# Patient Record
Sex: Male | Born: 1950 | Race: White | Hispanic: No | Marital: Married
Health system: Midwestern US, Academic
[De-identification: ages and names within clinical notes are randomized; demographics above are authoritative.]

---

## 2016-05-07 MED ORDER — PERFLUTREN LIPID MICROSPHERES 1.1 MG/ML IV SUSP
1-20 mL | Freq: Once | INTRAVENOUS | 0 refills | Status: CP
Start: 2016-05-07 — End: ?

## 2016-05-07 MED ORDER — GADOBENATE DIMEGLUMINE 529 MG/ML (0.1MMOL/0.2ML) IV SOLN
20 mL | Freq: Once | INTRAVENOUS | 0 refills | Status: CP
Start: 2016-05-07 — End: ?

## 2016-05-18 MED ORDER — LORAZEPAM 1 MG PO TAB
1 mg | Freq: Once | ORAL | 0 refills | Status: CP
Start: 2016-05-18 — End: ?

## 2016-05-21 MED ORDER — SODIUM CHLORIDE 0.9 % IJ SOLN
50 mL | Freq: Once | INTRAVENOUS | 0 refills | Status: CP
Start: 2016-05-21 — End: ?

## 2016-05-21 MED ORDER — IOPAMIDOL 76 % IV SOLN
100 mL | Freq: Once | INTRAVENOUS | 0 refills | Status: CP
Start: 2016-05-21 — End: ?

## 2016-07-09 ENCOUNTER — Encounter: Admit: 2016-07-09 | Discharge: 2016-07-09 | Payer: MEDICARE

## 2016-07-09 DIAGNOSIS — F329 Major depressive disorder, single episode, unspecified: ICD-10-CM

## 2016-07-09 DIAGNOSIS — C439 Malignant melanoma of skin, unspecified: ICD-10-CM

## 2016-07-09 DIAGNOSIS — D709 Neutropenia, unspecified: ICD-10-CM

## 2016-07-09 DIAGNOSIS — R5383 Other fatigue: ICD-10-CM

## 2016-07-09 DIAGNOSIS — Z923 Personal history of irradiation: ICD-10-CM

## 2016-07-09 DIAGNOSIS — F419 Anxiety disorder, unspecified: ICD-10-CM

## 2016-07-09 DIAGNOSIS — C773 Secondary and unspecified malignant neoplasm of axilla and upper limb lymph nodes: ICD-10-CM

## 2016-07-09 DIAGNOSIS — I10 Essential (primary) hypertension: ICD-10-CM

## 2016-07-09 DIAGNOSIS — C499 Malignant neoplasm of connective and soft tissue, unspecified: ICD-10-CM

## 2016-07-09 DIAGNOSIS — R002 Palpitations: ICD-10-CM

## 2016-07-09 DIAGNOSIS — E039 Hypothyroidism, unspecified: ICD-10-CM

## 2016-07-09 DIAGNOSIS — H9190 Unspecified hearing loss, unspecified ear: ICD-10-CM

## 2016-07-09 DIAGNOSIS — S129XXA Fracture of neck, unspecified, initial encounter: ICD-10-CM

## 2016-07-09 DIAGNOSIS — C4359 Malignant melanoma of other part of trunk: Principal | ICD-10-CM

## 2016-07-09 DIAGNOSIS — K219 Gastro-esophageal reflux disease without esophagitis: ICD-10-CM

## 2016-07-09 DIAGNOSIS — C61 Malignant neoplasm of prostate: Principal | ICD-10-CM

## 2016-07-09 DIAGNOSIS — I890 Lymphedema, not elsewhere classified: ICD-10-CM

## 2016-07-09 DIAGNOSIS — M1990 Unspecified osteoarthritis, unspecified site: ICD-10-CM

## 2016-07-09 DIAGNOSIS — E785 Hyperlipidemia, unspecified: ICD-10-CM

## 2016-07-09 DIAGNOSIS — R202 Paresthesia of skin: ICD-10-CM

## 2016-07-09 LAB — COMPREHENSIVE METABOLIC PANEL
Lab: 0.4 mg/dL (ref 0.3–1.2)
Lab: 0.9 mg/dL — ABNORMAL HIGH (ref 0.4–1.24)
Lab: 105 MMOL/L (ref 98–110)
Lab: 136 MMOL/L — ABNORMAL LOW (ref 137–147)
Lab: 17 mg/dL (ref 7–25)
Lab: 22 U/L (ref 7–56)
Lab: 27 MMOL/L (ref 21–30)
Lab: 3.7 MMOL/L — ABNORMAL LOW (ref 3.5–5.1)
Lab: 31 U/L (ref 7–40)
Lab: 4 K/UL (ref 3–12)
Lab: 4 g/dL (ref 3.5–5.0)
Lab: 60 U/L (ref 25–110)
Lab: 7.1 g/dL (ref 6.0–8.0)
Lab: 9.4 mg/dL (ref 8.5–10.6)
Lab: 93 mg/dL (ref 70–100)
Lab: >60 mL/min (ref >60)
Lab: >60 mL/min (ref >60)

## 2016-07-09 LAB — CBC AND DIFF
Lab: 0 10*3/uL (ref 0–0.20)
Lab: 3.2 10*3/uL — ABNORMAL LOW (ref 4.5–11.0)
Lab: 4 M/UL — ABNORMAL LOW (ref 4.4–5.5)

## 2016-07-09 NOTE — Progress Notes
Name: Gabriel Robertson          MRN: [id number]      DOB: [birth year]      AGE: 66 y.o.   DATE OF SERVICE: 07/09/2016    Subjective:             Reason for Visit:  Heme/Onc Care      Gabriel Robertson is a 66 y.o. male.     Cancer Staging  Melanoma (HCC)  Staging form: Melanoma of the Skin, AJCC 7th Edition  - Clinical: No stage assigned - Unsigned  - Pathologic: Stage IIIC (T4b, N2b, cM0) - Signed by Sherril Croon, PA-C on 05/15/2014      History of Present Illness  Name: Gabriel Robertson          MRN: [id number]      DOB: [birth year]      AGE: 66 y.o.   DATE OF SERVICE: 07/09/2016    Subjective:             Reason for Visit: Melanoma  Heme/Onc Care      Jacyon Anuel Quesnel is a 66 y.o. male.     Cancer Staging  Melanoma (HCC)  Staging form: Melanoma of the Skin, AJCC 7th Edition  - Clinical: No stage assigned - Unsigned  - Pathologic: Stage IIIC (T4b, N2b, cM0) - Signed by Sherril Croon, PA-C on 05/15/2014      History of Present Illness      [name] presents with his wife today for follow up of his metastatic melanoma.  He continues on dabrafenib + trametinib, dabrafenib at reduced dose of 75 mg twice daily and trametinib 2 mg daily.   He feels quite well, denies complaints.   He is tolerating targeted agents well.  At decreased dabrafenib dose he is not experiencing fever or chills.   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  He now has a pump to help with swelling, he feels this has been beneficial.   He denies signs or symptoms suggestive of infection.   Denies pain.                Review of Systems   Constitutional: Positive for fatigue (chronic). Negative for activity change, appetite change, chills and fever.   HENT: Negative for mouth sores.    Respiratory: Negative for cough and shortness of breath.    Cardiovascular: Negative for chest pain and leg swelling.   Gastrointestinal: Negative for diarrhea, nausea and vomiting.   Musculoskeletal: Negative for arthralgias and myalgias. Upper extremity swelling    Skin: Negative for rash.   Neurological: Negative for seizures, weakness, light-headedness and headaches.   Psychiatric/Behavioral: The patient is not nervous/anxious.          Objective:         ??? aspirin EC 81 mg tablet Take 81 mg by mouth daily.   ??? dabrafenib (TAFINLAR) 75 mg capsule Take 75 mg in the am and 150 mg in the pm   ??? diazepam (VALIUM) 10 mg tablet Take 1 Tab by mouth every 6 hours as needed for Anxiety.   ??? gabapentin (NEURONTIN) 100 mg capsule Take 3 capsules by mouth three times daily.   ??? HYDROcodone/acetaminophen(+) (NORCO) 10/325 mg tablet    ??? levothyroxine (SYNTHROID) 150 mcg tablet Take 1 Tab by mouth daily 30 minutes before breakfast.   ??? MULTIVITAMIN PO Take  by mouth daily.   ??? ondansetron hcl (ZOFRAN) 8 mg tablet Take 1 Tab by mouth  every 8 hours as needed for Nausea or Vomiting.   ??? polyethylene glycol 3350 (MIRALAX) 17 g packet Take 17 g by mouth daily as needed.   ??? senna/docusate (SENOKOT-S) 8.6/50 mg tablet Take 1 Tab by mouth daily as needed (As needed for constipation). Indications: CONSTIPATION   ??? sertraline (ZOLOFT) 100 mg tablet Take 1 tablet by mouth daily.   ??? trametinib (MEKINIST) 2 mg tablet Take 1 tablet by mouth daily. Take at least 1 hour before or 2 hours after food.   ??? zolpidem (AMBIEN) 10 mg tablet Take 1 tablet by mouth at bedtime as needed for Sleep.     Vitals:    07/09/16 1025   BP: 155/88   Pulse: 55   Resp: 16   Temp: 36.4 ???C (97.5 ???F)   TempSrc: Oral   SpO2: 100%   Weight: 98.2 kg (216 lb 9.6 oz)   Height: 190.5 cm (75)     Body mass index is 27.07 kg/m???.     Pain Score: Six  Pain Loc: Generalized      Pain Addressed:  N/A    Patient Evaluated for a Clinical Trial: No treatment clinical trial available for this patient.     [place] Cooperative Oncology Group performance status is 0, Fully active, able to carry on all pre-disease performance without restriction.Marland Kitchen     Physical Exam Constitutional: He is oriented to person, place, and time. He appears well-developed and well-nourished. No distress.   HENT:   Head: Normocephalic.   Mouth/Throat: Oropharynx is clear and moist.   Eyes: Conjunctivae and EOM are normal. Pupils are equal, round, and reactive to light.   Cardiovascular: Normal rate, regular rhythm and normal heart sounds.    Pulmonary/Chest: Effort normal and breath sounds normal.   Extensive black skin lesions scattered across anterior right chest extending to right scapula, macular, less pigmented     Skin lesions also at left anterior chest, stable   Abdominal: Soft. Bowel sounds are normal. He exhibits no mass. There is no hepatosplenomegaly. There is no tenderness. There is no guarding.   Musculoskeletal: He exhibits no edema.   Bilateral upper extremity lymphedema   Lymphadenopathy:     He has no cervical adenopathy.   Left axillae, fluid collection, unchanged   Neurological: He is alert and oriented to person, place, and time. No cranial nerve deficit.   Skin: Skin is warm and dry. No rash noted.   Psychiatric: He has a normal mood and affect. His behavior is normal. Judgment and thought content normal.        CBC w/Diff    Lab Results   Component Value Date/Time    WBC 3.2 (L) 07/09/2016 09:25 AM    RBC 4.01 (L) 07/09/2016 09:25 AM    HGB 12.8 (L) 07/09/2016 09:25 AM    HCT 36.4 (L) 07/09/2016 09:25 AM    MCV 90.8 07/09/2016 09:25 AM    MCH 31.9 07/09/2016 09:25 AM    MCHC 35.1 07/09/2016 09:25 AM    RDW 15.2 (H) 07/09/2016 09:25 AM    PLTCT 189 07/09/2016 09:25 AM    MPV 7.3 07/09/2016 09:25 AM    Lab Results   Component Value Date/Time    NEUT 57 07/09/2016 09:25 AM    ANC 1.90 07/09/2016 09:25 AM    LYMA 31 07/09/2016 09:25 AM    ALC 1.00 07/09/2016 09:25 AM    MONA 10 07/09/2016 09:25 AM    AMC 0.30 07/09/2016 09:25 AM  EOSA 1 07/09/2016 09:25 AM    AEC 0.00 07/09/2016 09:25 AM    BASA 1 07/09/2016 09:25 AM    ABC 0.00 07/09/2016 09:25 AM Comprehensive Metabolic Profile    Lab Results   Component Value Date/Time    NA 136 (L) 07/09/2016 09:25 AM    K 3.7 07/09/2016 09:25 AM    CL 105 07/09/2016 09:25 AM    CO2 27 07/09/2016 09:25 AM    GAP 4 07/09/2016 09:25 AM    BUN 17 07/09/2016 09:25 AM    CR 0.93 07/09/2016 09:25 AM    GLU 93 07/09/2016 09:25 AM    Lab Results   Component Value Date/Time    CA 9.4 07/09/2016 09:25 AM    PO4 2.7 02/15/2015 04:35 PM    ALBUMIN 4.0 07/09/2016 09:25 AM    TOTPROT 7.1 07/09/2016 09:25 AM    ALKPHOS 60 07/09/2016 09:25 AM    AST 31 07/09/2016 09:25 AM    ALT 22 07/09/2016 09:25 AM    TOTBILI 0.4 07/09/2016 09:25 AM    GFR >60 07/09/2016 09:25 AM    GFRAA >60 07/09/2016 09:25 AM           Assessment and Plan:    Problem   Fatigue   Lymphedema of Upper Extremity   Neutropenia (Hcc)   Melanoma (Hcc)    DIAGNOSIS: Melanoma  BRAF STATUS: POSITIVE  PAST THERAPY: S/P excisional biopsy of mid back lesion (03/19/2014) revealing malignant melanoma, superficial spreading, 5mm, with ulceration, mitotic index 5/mm2. Regression, microsatellitosis, perineural invasion, and tumor infiltrating lymphocytes were all present. Suspicious for lymphovascular invasion. He underwent WLE and bilateral SLN biopsy (05/04/2014). Negative for residual melanoma with 1/2 right axillary sentinel LNs positive for metastatic melanoma and 3/5 left axillary sentinel LNs positive. No extracapsular extension noted. MRI brain & PET (05/17/2014) were negative for distant metastatic disease. S/P bilateral completion axillary lymphadenectomies (06/30/2014). Pathology demonstrated metastatic melanoma in 3/10 right axillary nodes with multiple foci of melanoma involving the perinodal soft tissue. There was no evidence of melanoma in 11 left axillary nodes; however, there was metastatic melanoma noted involving the perinodal soft tissue on the left too. He completed radiation therapy to the bilateral axillae and back (08/2014). 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 S/P Keytruda x 3 cycles, stopped secondary to disease progression. He was then started on dabrafenib + trametinib (01/2015). The dabrafenib was dose-reduced to 75mg  BID secondary to neutropenia (02/26/2015), and then stopped secondary to fevers (03/04/2015)  PRESENT THERAPY: Dabrafenib 75mg  twice daily + trametinib 2mg  daily  Patient consented for the Melanoma Survivorship Registry 06/08/2016         Lymphedema of upper extremity  Stable, continues to follow with lymphedema specialist    Melanoma (HCC)  No progressive disease by history or exam    Neutropenia (HCC)  ANC is stable    Fatigue  Chronic, unchanged    PLAN  -Mr. Sigala is tolerating dab/tramtinib well.  He will continue dabrafenib at reduced dose of 75 mg twice daily in addition to trametinib 2 mg daily  -He has appointment scheduled with Dr. Regino Schultze on 8/2.  Will see him that day with CBC, CMP  -Plan for echo, CT CAP in 8 weeks  -Continue follow up with lymphedema specialist as scheduled  -Encouraged close F/U in the interim as needed.  Mr. Breaker verbalized understanding and agrees to plan as outlined above  Twanna Hy, ARNP

## 2016-07-09 NOTE — Assessment & Plan Note
Stable, continues to follow with lymphedema specialist

## 2016-07-09 NOTE — Assessment & Plan Note
ANC is stable

## 2016-07-09 NOTE — Assessment & Plan Note
No progressive disease by history or exam

## 2016-07-09 NOTE — Assessment & Plan Note
Chronic, unchanged

## 2016-08-03 ENCOUNTER — Encounter: Admit: 2016-08-03 | Discharge: 2016-08-03 | Payer: MEDICARE

## 2016-08-10 DIAGNOSIS — L599 Disorder of the skin and subcutaneous tissue related to radiation, unspecified: Principal | ICD-10-CM

## 2016-08-18 ENCOUNTER — Encounter: Admit: 2016-08-18 | Discharge: 2016-08-18 | Payer: MEDICARE

## 2016-08-18 ENCOUNTER — Encounter: Admit: 2016-08-10 | Discharge: 2016-08-18 | Payer: MEDICARE

## 2016-08-18 DIAGNOSIS — C439 Malignant melanoma of skin, unspecified: Principal | ICD-10-CM

## 2016-08-18 DIAGNOSIS — L905 Scar conditions and fibrosis of skin: ICD-10-CM

## 2016-08-19 ENCOUNTER — Ambulatory Visit: Admit: 2016-08-19 | Discharge: 2016-09-02 | Payer: MEDICARE

## 2016-08-20 ENCOUNTER — Encounter: Admit: 2016-08-20 | Discharge: 2016-08-21 | Payer: MEDICARE

## 2016-08-20 ENCOUNTER — Encounter: Admit: 2016-08-20 | Discharge: 2016-08-20 | Payer: MEDICARE

## 2016-08-20 ENCOUNTER — Ambulatory Visit: Admit: 2016-08-20 | Discharge: 2016-08-20 | Payer: MEDICARE

## 2016-08-20 ENCOUNTER — Encounter: Admit: 2016-08-20 | Discharge: 2016-09-18 | Payer: MEDICARE

## 2016-08-20 DIAGNOSIS — Z923 Personal history of irradiation: ICD-10-CM

## 2016-08-20 DIAGNOSIS — R5383 Other fatigue: ICD-10-CM

## 2016-08-20 DIAGNOSIS — C773 Secondary and unspecified malignant neoplasm of axilla and upper limb lymph nodes: ICD-10-CM

## 2016-08-20 DIAGNOSIS — C4359 Malignant melanoma of other part of trunk: Principal | ICD-10-CM

## 2016-08-20 DIAGNOSIS — F419 Anxiety disorder, unspecified: ICD-10-CM

## 2016-08-20 DIAGNOSIS — C7931 Secondary malignant neoplasm of brain: ICD-10-CM

## 2016-08-20 DIAGNOSIS — F329 Major depressive disorder, single episode, unspecified: ICD-10-CM

## 2016-08-20 DIAGNOSIS — Z7982 Long term (current) use of aspirin: ICD-10-CM

## 2016-08-20 DIAGNOSIS — C61 Malignant neoplasm of prostate: Principal | ICD-10-CM

## 2016-08-20 DIAGNOSIS — K219 Gastro-esophageal reflux disease without esophagitis: ICD-10-CM

## 2016-08-20 DIAGNOSIS — E039 Hypothyroidism, unspecified: ICD-10-CM

## 2016-08-20 DIAGNOSIS — H9190 Unspecified hearing loss, unspecified ear: ICD-10-CM

## 2016-08-20 DIAGNOSIS — E785 Hyperlipidemia, unspecified: ICD-10-CM

## 2016-08-20 DIAGNOSIS — I10 Essential (primary) hypertension: ICD-10-CM

## 2016-08-20 DIAGNOSIS — R002 Palpitations: ICD-10-CM

## 2016-08-20 DIAGNOSIS — C439 Malignant melanoma of skin, unspecified: ICD-10-CM

## 2016-08-20 DIAGNOSIS — R202 Paresthesia of skin: ICD-10-CM

## 2016-08-20 DIAGNOSIS — M1990 Unspecified osteoarthritis, unspecified site: ICD-10-CM

## 2016-08-20 DIAGNOSIS — S129XXA Fracture of neck, unspecified, initial encounter: ICD-10-CM

## 2016-08-20 DIAGNOSIS — D709 Neutropenia, unspecified: ICD-10-CM

## 2016-08-20 LAB — CBC AND DIFF
Lab: 2.9 10*3/uL — ABNORMAL LOW (ref 4.5–11.0)
Lab: 3.7 M/UL — ABNORMAL LOW (ref 4.4–5.5)

## 2016-08-20 LAB — COMPREHENSIVE METABOLIC PANEL
Lab: 1 mg/dL (ref 0.4–1.24)
Lab: 101 MMOL/L (ref 98–110)
Lab: 136 MMOL/L — ABNORMAL LOW (ref 137–147)
Lab: 20 mg/dL (ref 7–25)
Lab: 3.9 MMOL/L — ABNORMAL LOW (ref 3.5–5.1)
Lab: 6.6 g/dL (ref 6.0–8.0)
Lab: 9 mg/dL (ref 8.5–10.6)
Lab: 94 mg/dL (ref 70–100)

## 2016-08-20 LAB — POC CREATININE, RAD: Lab: 1.1 mg/dL (ref 0.4–1.24)

## 2016-08-20 MED ORDER — SODIUM CHLORIDE 0.9 % IJ SOLN
10 mL | Freq: Once | INTRAVENOUS | 0 refills | Status: CP
Start: 2016-08-20 — End: ?
  Administered 2016-08-20: 16:00:00 10 mL via INTRAVENOUS

## 2016-08-20 MED ORDER — GADOBENATE DIMEGLUMINE 529 MG/ML (0.1MMOL/0.2ML) IV SOLN
20 mL | Freq: Once | INTRAVENOUS | 0 refills | Status: CP
Start: 2016-08-20 — End: ?
  Administered 2016-08-20: 16:00:00 20 mL via INTRAVENOUS

## 2016-08-20 MED ORDER — LORAZEPAM 1 MG PO TAB
1 mg | ORAL_TABLET | ORAL | 0 refills | 12.00000 days | Status: AC | PRN
Start: 2016-08-20 — End: 2016-11-10

## 2016-08-20 NOTE — Progress Notes
Date:  08/20/2016       Gabriel Robertson is a 66 y.o. male.     There were no encounter diagnoses.  Staging: Cancer Staging  Melanoma (HCC)  Staging form: Melanoma of the Skin, AJCC 7th Edition  - Clinical: No stage assigned - Unsigned  - Pathologic: Stage IIIC (T4b, N2b, cM0) - Signed by Sherril Croon, PA-C on 05/15/2014        Subjective:          Cancer Staging  Melanoma (HCC)  Staging form: Melanoma of the Skin, AJCC 7th Edition  - Clinical: No stage assigned - Unsigned  - Pathologic: Stage IIIC (T4b, N2b, cM0) - Signed by Sherril Croon, PA-C on 05/15/2014      History of Present Illness  Prior RT:  1. SRS to right frontal lobe metastasis, completed1/16/18  2. SRS to L frontotemporal lobe and right cerebellum, 20 Gy x 1, completed 05/18/16    Gabriel Robertson is a 66 yo gentleman with metastatic melanoma to the brain s/p SRS courses as above. He continues on dabrafenib and tremetinib. He returns today for follow-up with MRI. He endorses overall weakness and some dizziness recently. He denies headaches, N/V, or unsteady gait. No acute vision changes. He denies seizures. No focal extremity weakness or numbness.      Review of Systems   HENT: Positive for hearing loss and tinnitus.    Musculoskeletal: Positive for arthralgias, joint swelling, neck pain and neck stiffness.   Neurological: Positive for dizziness and light-headedness.   All other systems reviewed and are negative.        Objective:         ??? aspirin EC 81 mg tablet Take 81 mg by mouth daily.   ??? dabrafenib (TAFINLAR) 75 mg capsule Take 75 mg in the am and 150 mg in the pm   ??? diazepam (VALIUM) 10 mg tablet Take 1 Tab by mouth every 6 hours as needed for Anxiety.   ??? gabapentin (NEURONTIN) 100 mg capsule Take 3 capsules by mouth three times daily.   ??? HYDROcodone/acetaminophen(+) (NORCO) 10/325 mg tablet    ??? levothyroxine (SYNTHROID) 150 mcg tablet Take 1 Tab by mouth daily 30 minutes before breakfast.   ??? MULTIVITAMIN PO Take  by mouth daily. ??? ondansetron hcl (ZOFRAN) 8 mg tablet Take 1 Tab by mouth every 8 hours as needed for Nausea or Vomiting.   ??? polyethylene glycol 3350 (MIRALAX) 17 g packet Take 17 g by mouth daily as needed.   ??? senna/docusate (SENOKOT-S) 8.6/50 mg tablet Take 1 Tab by mouth daily as needed (As needed for constipation). Indications: CONSTIPATION   ??? sertraline (ZOLOFT) 100 mg tablet Take 1 tablet by mouth daily.   ??? trametinib (MEKINIST) 2 mg tablet Take 1 tablet by mouth daily. Take at least 1 hour before or 2 hours after food.   ??? zolpidem (AMBIEN) 10 mg tablet Take 1 tablet by mouth at bedtime as needed for Sleep.     Vitals:    08/20/16 1336   BP: 102/64   Pulse: 58   Temp: 36.2 ???C (97.2 ???F)   TempSrc: Temporal   SpO2: 98%   Weight: 99.2 kg (218 lb 12.8 oz)   Height: 190.5 cm (75)     Body mass index is 27.35 kg/m???.     Pain Score: Seven        KARNOFSKY PERFORMANCE SCORE:  90% Able to carry on normal activity; minor signs of disease  Physical Exam     General:  AAOx3, in no acute distress.  Head:  Normocephalic and atraumatic.  Eyes: Normal sclerae / conjunctivae. EOMI.  Neck: Supple, trachea midline, no palpable lymphadenopathy  Lungs: Breathing nonlabored, no wheezing  Extremities: warm, no edema  Neurologic:  CN II-XII grossly intact, no focal motor deficits.   Psychiatric: Normal mood and affect         Mri Head Wo/w Contrast    Result Date: 08/20/2016  MRI BRAIN HISTORY: 66 year old male, brain metastasis, malignant melanoma, unspecified site TECHNIQUE: Multiplanar and multisequence MR imaging of the head was performed. This was done both before and after the administration of gadolinium contrast. COMPARISON: MRI brain May 06, 2016 FINDINGS: No significant change in size of small 4 mm right frontal lobe and tiny 2 mm left precentral gyrus enhancing metastases (series 11 images 145 and 189). Development of a new tiny 1 to 2 mm cortically based enhancing lesion within the right deep precentral sulcus (series 11 image 186). Additionally there is been development of or increased conspicuity of two small enhancing right cerebellar lesions (series 11 image 58 and 38). Additional small area of enhancement within the right central sulcus (series 11 image 181) is felt to be vascular in nature. The ventricles and subarachnoid spaces are normal in size and configuration. Brain parenchyma is otherwise normal in signal. There is no midline shift or mass effect. The vascular flow-voids are unremarkable. Diffusion weighted imaging is not indicative of acute or recent infarct. Patchy signal intensity and focal linear enhancement within the left upper clivus is unchanged (series 11 image 54, series 7 image 6).     1.  Stable small right frontal lobe and left precentral gyrus enhancing metastases. 2.  Development of a tiny right deep precentral sulcus and two small right cerebellar enhancing lesions concerning for additional metastases.  Approved by Elfredia Nevins, M.D. on 08/20/2016 11:51 AM By my electronic signature, I attest that I have personally reviewed the images for this examination and formulated the interpretations and opinions expressed in this report  Finalized by Dimple Casey, M.D. on 08/20/2016 12:08 PM. Dictated by Elfredia Nevins, M.D. on 08/20/2016 11:19 AM.        Assessment and Plan:  66 yo gentleman with metastatic melanoma to the brain s/p SRS to a right frontal lesion in 01/2016 and more recently SRS to a right cerebellar and left frontotemporal lobe metastasis in April 2018.     MRI shows growth in three intracranial lesions that have not been previously treated: a right anterior frontal lesion, a right lateral frontal lesion, and a right cerebellar lesion. The MRI reports development of two right cerebeller lesions, however one has been previously treated in April and appears stable.     Recommend SRS to 20 Gy to three enlarging metastatic lesions. Patient agreed to proceed with treatment. Informed consent obtained, simulation scheduled for next week.     April Manson, M.D.  Radiation Oncology Resident, PGY-3  [facility]  Pager [phone]         ATTESTATION    I personally performed the key portions of the E/M visit, discussed case with resident and concur with resident documentation of history, physical exam, assessment, and treatment plan unless otherwise noted.    Staff name:  Jason Nest, MD Date:  08/21/2016

## 2016-08-21 NOTE — Assessment & Plan Note
Grade 1, unchanged

## 2016-08-21 NOTE — Assessment & Plan Note
Managing for the most part

## 2016-08-21 NOTE — Assessment & Plan Note
ANC has been stable, 1.5 today

## 2016-08-21 NOTE — Progress Notes
Name: Gabriel Robertson          MRN: [id number]      DOB: 02/03/1950      AGE: 66 y.o.   DATE OF SERVICE: 08/20/2016    Subjective:             Reason for Visit:  Heme/Onc Care      Gabriel Robertson is a 66 y.o. male.     Cancer Staging  Melanoma (HCC)  Staging form: Melanoma of the Skin, AJCC 7th Edition  - Clinical: No stage assigned - Unsigned  - Pathologic: Stage IIIC (T4b, N2b, cM0) - Signed by Sherril Croon, PA-C on 05/15/2014      History of Present Illness    Name: Gabriel Robertson          MRN: [id number]      DOB: [birth year]      AGE: 66 y.o.   DATE OF SERVICE: 08/20/2016    Subjective:             Reason for Visit: Melanoma  Heme/Onc Care      Gabriel Robertson is a 66 y.o. male.     Cancer Staging  Melanoma (HCC)  Staging form: Melanoma of the Skin, AJCC 7th Edition  - Clinical: No stage assigned - Unsigned  - Pathologic: Stage IIIC (T4b, N2b, cM0) - Signed by Sherril Croon, PA-C on 05/15/2014      History of Present Illness      Gabriel Robertson presents with his wife today for follow up of his metastatic melanoma.  He continues on dabrafenib + trametinib, dabrafenib at reduced dose of 75 mg twice daily and trametinib 2 mg daily.   He reports feeling well.  He denies adverse symptoms related to targeted agents.   He denies signs or symptoms suggestive of infection.   He has mild arthralgias that are unchanged.   He had MRI head today and follow up with Dr. Regino Schultze.                Review of Systems   Constitutional: Positive for fatigue. Negative for activity change, appetite change, chills and fever.   HENT: Negative for mouth sores.    Respiratory: Negative for cough, shortness of breath and wheezing.    Cardiovascular: Negative for chest pain, palpitations and leg swelling.   Gastrointestinal: Positive for constipation. Negative for abdominal distention, abdominal pain, diarrhea, nausea and vomiting.   Musculoskeletal: Positive for arthralgias. Negative for myalgias.        Upper extremity swelling Skin: Negative for rash.   Neurological: Negative for seizures, weakness, light-headedness and headaches.   Psychiatric/Behavioral: The patient is not nervous/anxious.          Objective:         ??? aspirin EC 81 mg tablet Take 81 mg by mouth daily.   ??? dabrafenib (TAFINLAR) 75 mg capsule Take 75 mg in the am and 150 mg in the pm   ??? diazepam (VALIUM) 10 mg tablet Take 1 Tab by mouth every 6 hours as needed for Anxiety.   ??? gabapentin (NEURONTIN) 100 mg capsule Take 3 capsules by mouth three times daily.   ??? HYDROcodone/acetaminophen(+) (NORCO) 10/325 mg tablet    ??? levothyroxine (SYNTHROID) 150 mcg tablet Take 1 Tab by mouth daily 30 minutes before breakfast.   ??? LORazepam (ATIVAN) 1 mg tablet Take 1 tablet by mouth every 8 hours as needed for Other.... Take 1-2 tabs 30 minutes prior to CT simulation  and radiation treatment   ??? MULTIVITAMIN PO Take  by mouth daily.   ??? ondansetron hcl (ZOFRAN) 8 mg tablet Take 1 Tab by mouth every 8 hours as needed for Nausea or Vomiting.   ??? polyethylene glycol 3350 (MIRALAX) 17 g packet Take 17 g by mouth daily as needed.   ??? senna/docusate (SENOKOT-S) 8.6/50 mg tablet Take 1 Tab by mouth daily as needed (As needed for constipation). Indications: CONSTIPATION   ??? sertraline (ZOLOFT) 100 mg tablet Take 1 tablet by mouth daily.   ??? trametinib (MEKINIST) 2 mg tablet Take 1 tablet by mouth daily. Take at least 1 hour before or 2 hours after food.   ??? zolpidem (AMBIEN) 10 mg tablet Take 1 tablet by mouth at bedtime as needed for Sleep.     Vitals:    08/20/16 1134   BP: 123/67   Pulse: 51   Resp: 16   Temp: 36.4 ???C (97.5 ???F)   TempSrc: Axillary   SpO2: 99%   Weight: 99 kg (218 lb 3.2 oz)   Height: 190.5 cm (75)     Body mass index is 27.27 kg/m???.     Pain Score: Seven  Pain Loc: Neck (joint pain)      Pain Addressed:  N/A    Patient Evaluated for a Clinical Trial: No treatment clinical trial available for this patient. [place] Cooperative Oncology Group performance status is 0, Fully active, able to carry on all pre-disease performance without restriction.Marland Kitchen     Physical Exam   Constitutional: He is oriented to person, place, and time. He appears well-developed and well-nourished. No distress.   HENT:   Head: Normocephalic.   Mouth/Throat: Oropharynx is clear and moist.   Eyes: Conjunctivae and EOM are normal. Pupils are equal, round, and reactive to light.   Cardiovascular: Normal rate, regular rhythm and normal heart sounds.    Pulmonary/Chest: Effort normal and breath sounds normal.   Extensive black skin lesions scattered across anterior right chest extending to right scapula, macular, less pigmented     Skin lesions also at left anterior chest, unchanged   Abdominal: Soft. Bowel sounds are normal. He exhibits no mass. There is no hepatosplenomegaly. There is no tenderness. There is no guarding.   Musculoskeletal: He exhibits no edema.   Bilateral upper extremity lymphedema, R>L   Lymphadenopathy:     He has no cervical adenopathy.   Left axillae, fluid collection, unchanged   Neurological: He is alert and oriented to person, place, and time. No cranial nerve deficit.   Skin: Skin is warm and dry. No rash noted.   Psychiatric: He has a normal mood and affect. His behavior is normal. Judgment and thought content normal.        CBC w/Diff    Lab Results   Component Value Date/Time    WBC 2.9 (L) 08/20/2016 10:15 AM    RBC 3.76 (L) 08/20/2016 10:15 AM    HGB 12.0 (L) 08/20/2016 10:15 AM    HCT 34.4 (L) 08/20/2016 10:15 AM    MCV 91.3 08/20/2016 10:15 AM    MCH 31.8 08/20/2016 10:15 AM    MCHC 34.8 08/20/2016 10:15 AM    RDW 14.5 08/20/2016 10:15 AM    PLTCT 183 08/20/2016 10:15 AM    MPV 7.0 08/20/2016 10:15 AM    Lab Results   Component Value Date/Time    NEUT 52 08/20/2016 10:15 AM    ANC 1.50 (L) 08/20/2016 10:15 AM    LYMA 33 08/20/2016  10:15 AM    ALC 1.00 08/20/2016 10:15 AM    MONA 12 08/20/2016 10:15 AM AMC 0.30 08/20/2016 10:15 AM    EOSA 2 08/20/2016 10:15 AM    AEC 0.00 08/20/2016 10:15 AM    BASA 1 08/20/2016 10:15 AM    ABC 0.00 08/20/2016 10:15 AM      Comprehensive Metabolic Profile    Lab Results   Component Value Date/Time    NA 136 (L) 08/20/2016 10:15 AM    K 3.9 08/20/2016 10:15 AM    CL 101 08/20/2016 10:15 AM    CO2 32 (H) 08/20/2016 10:15 AM    GAP 3 08/20/2016 10:15 AM    BUN 20 08/20/2016 10:15 AM    CR 1.04 08/20/2016 10:15 AM    GLU 94 08/20/2016 10:15 AM    Lab Results   Component Value Date/Time    CA 9.0 08/20/2016 10:15 AM    PO4 2.7 02/15/2015 04:35 PM    ALBUMIN 3.9 08/20/2016 10:15 AM    TOTPROT 6.6 08/20/2016 10:15 AM    ALKPHOS 67 08/20/2016 10:15 AM    AST 37 08/20/2016 10:15 AM    ALT 19 08/20/2016 10:15 AM    TOTBILI 0.3 08/20/2016 10:15 AM    GFR >60 08/20/2016 10:15 AM    GFRAA >60 08/20/2016 10:15 AM           Assessment and Plan:    Problem   Fatigue   Neutropenia (Hcc)   Anxiety and Depression   Melanoma (Hcc)    DIAGNOSIS: Melanoma  BRAF STATUS: POSITIVE  PAST THERAPY: S/P excisional biopsy of mid back lesion (03/19/2014) revealing malignant melanoma, superficial spreading, 5mm, with ulceration, mitotic index 5/mm2. Regression, microsatellitosis, perineural invasion, and tumor infiltrating lymphocytes were all present. Suspicious for lymphovascular invasion. He underwent WLE and bilateral SLN biopsy (05/04/2014). Negative for residual melanoma with 1/2 right axillary sentinel LNs positive for metastatic melanoma and 3/5 left axillary sentinel LNs positive. No extracapsular extension noted. MRI brain & PET (05/17/2014) were negative for distant metastatic disease. S/P bilateral completion axillary lymphadenectomies (06/30/2014). Pathology demonstrated metastatic melanoma in 3/10 right axillary nodes with multiple foci of melanoma involving the perinodal soft tissue. There was no evidence of melanoma in 11 left axillary nodes; however, there was metastatic melanoma noted involving the perinodal soft tissue on the left too. He completed radiation therapy to the bilateral axillae and back (08/2014). 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 S/P Keytruda x 3 cycles, stopped secondary to disease progression. He was then started on dabrafenib + trametinib (01/2015). The dabrafenib was dose-reduced to 75mg  BID secondary to neutropenia (02/26/2015), and then stopped secondary to fevers (03/04/2015)  PRESENT THERAPY: Dabrafenib 75mg  twice daily + trametinib 2mg  daily  Patient consented for the Melanoma Survivorship Registry 06/08/2016           Melanoma [facility])  No clear progressive disease by history or exam  MRI head pending, he has appointment scheduled with Dr. Regino Schultze today  CT scheduled in 2 weeks      Anxiety and depression  Managing for the most part    Neutropenia (HCC)  ANC has been stable, 1.5 today    Fatigue  Grade 1, unchanged    PLAN  -Gabriel Robertson is tolerating dab/tramtinib well.  He will continue dabrafenib at reduced dose of 75 mg twice daily in addition to trametinib 2 mg daily  -His MRI  head is pending, he has appointment with Dr. Regino Schultze this afternoon to discuss MRI from today  -Plan for echo, CT CAP in 3 weeks  -RTC to see Dr. Merla Riches following staging scans  -Continue follow up with lymphedema specialist as scheduled  -Encouraged close F/U in the interim as needed.  Gabriel Robertson verbalized understanding and agrees to plan as outlined above    Twanna Hy, ARNP         Addendum:   IMPRESSION      1. ???Stable small right frontal lobe and left precentral gyrus enhancing   metastases.  2. ???Development of a tiny right deep precentral sulcus and two small right   cerebellar enhancing lesions concerning for additional metastases.      MRI head results above.  He is to undergo SRS to new lesions.  Will further evaluate disease as scheduled in two weeks.  Will discuss with Dr. Merla Riches and develop plan based on results.

## 2016-08-25 ENCOUNTER — Encounter: Admit: 2016-08-25 | Discharge: 2016-08-25 | Payer: MEDICARE

## 2016-09-01 ENCOUNTER — Encounter: Admit: 2016-09-01 | Discharge: 2016-09-01 | Payer: MEDICARE

## 2016-09-01 NOTE — Telephone Encounter
Oral Chemotherapy Reassessment Note    Appropriateness of Therapy    Gabriel Robertson continues on dabrafenib and trametinib for the treatment of metastatic melanoma.  Cycle 1 start date was 02/2015. The regimen of dabrafenib 75 mg twice daily and trametinib 2 mg daily is appropriate to continue at this time. Dabrafenib was dose reduced due to neutropenia.    No dose adjustments based on toxicity are required at this time.     Treatment will continue until progression or unacceptable toxicity.        CBC w/Diff    Lab Results   Component Value Date/Time    WBC 2.9 (L) 08/20/2016 10:15 AM    RBC 3.76 (L) 08/20/2016 10:15 AM    HGB 12.0 (L) 08/20/2016 10:15 AM    HCT 34.4 (L) 08/20/2016 10:15 AM    MCV 91.3 08/20/2016 10:15 AM    MCH 31.8 08/20/2016 10:15 AM    MCHC 34.8 08/20/2016 10:15 AM    RDW 14.5 08/20/2016 10:15 AM    PLTCT 183 08/20/2016 10:15 AM    MPV 7.0 08/20/2016 10:15 AM    Lab Results   Component Value Date/Time    NEUT 52 08/20/2016 10:15 AM    ANC 1.50 (L) 08/20/2016 10:15 AM    LYMA 33 08/20/2016 10:15 AM    ALC 1.00 08/20/2016 10:15 AM    MONA 12 08/20/2016 10:15 AM    AMC 0.30 08/20/2016 10:15 AM    EOSA 2 08/20/2016 10:15 AM    AEC 0.00 08/20/2016 10:15 AM    BASA 1 08/20/2016 10:15 AM    ABC 0.00 08/20/2016 10:15 AM        Comprehensive Metabolic Profile    Lab Results   Component Value Date/Time    NA 136 (L) 08/20/2016 10:15 AM    K 3.9 08/20/2016 10:15 AM    CL 101 08/20/2016 10:15 AM    CO2 32 (H) 08/20/2016 10:15 AM    GAP 3 08/20/2016 10:15 AM    BUN 20 08/20/2016 10:15 AM    CR 1.04 08/20/2016 10:15 AM    GLU 94 08/20/2016 10:15 AM    Lab Results   Component Value Date/Time    CA 9.0 08/20/2016 10:15 AM    PO4 2.7 02/15/2015 04:35 PM    ALBUMIN 3.9 08/20/2016 10:15 AM    TOTPROT 6.6 08/20/2016 10:15 AM    ALKPHOS 67 08/20/2016 10:15 AM    AST 37 08/20/2016 10:15 AM    ALT 19 08/20/2016 10:15 AM    TOTBILI 0.3 08/20/2016 10:15 AM    GFR >60 08/20/2016 10:15 AM GFRAA >60 08/20/2016 10:15 AM              Serum creatinine: 1.04 mg/dL 16/10/96 0454  Estimated creatinine clearance: 95.4 mL/min      Response to Therapy    Patient???s electronic medical record has been reviewed. No evidence of progression that would necessitate a change of therapy has been identified.   Imaging:MRI head on 08/20/16: Development of a tiny right deep precentral sulcus and two small right cerebellar enhancing lesions concerning for additional metastases. He is to undergo SRS to new lesions    Adverse Effects Assessment    Gabriel Robertson is not experiencing any significant adverse effects to this medication regimen. He does state he gets some nausea after taking the dabrafenib but does not interfere with taking medication    Adherence Assessment    Gabriel Robertson reports missing 1 doses  over the past 1 month(s) not related to toxicity. Patient was re-educated on importance of adherence.     Medication Reconciliation    A medication history and reconciliation was performed (including prescription medications, supplements, over the counter medications, and herbal products). The medication list was updated and the patient???s current medication list is included below.     Prior to Admission medications    Medication Sig Start Date End Date Taking? Authorizing Provider   aspirin EC 81 mg tablet Take 81 mg by mouth daily.    Provider, Historical   dabrafenib (TAFINLAR) 75 mg capsule Take 75 mg in the am and 150 mg in the pm 10/17/15   Fredia Sorrow, APRN   diazepam (VALIUM) 10 mg tablet Take 1 Tab by mouth every 6 hours as needed for Anxiety. 08/16/14   Diamantina Providence, MD   gabapentin (NEURONTIN) 100 mg capsule Take 3 capsules by mouth three times daily. 04/07/16   Twanna Hy, APRN   HYDROcodone/acetaminophen(+) [facility]) 10/325 mg tablet  01/23/16   HISTORICAL PROVIDER   levothyroxine (SYNTHROID) 150 mcg tablet Take 1 Tab by mouth daily 30 minutes before breakfast. 10/29/14   Lenell Antu, MD   LORazepam (ATIVAN) 1 mg tablet Take 1 tablet by mouth every 8 hours as needed for Other.... Take 1-2 tabs 30 minutes prior to CT simulation and radiation treatment 08/20/16   Jason Nest, MD   MULTIVITAMIN PO Take  by mouth daily.    Provider, Historical   ondansetron hcl (ZOFRAN) 8 mg tablet Take 1 Tab by mouth every 8 hours as needed for Nausea or Vomiting. 10/25/14   Lenell Antu, MD   polyethylene glycol 3350 (MIRALAX) 17 g packet Take 17 g by mouth daily as needed.    Provider, Historical   senna/docusate (SENOKOT-S) 8.6/50 mg tablet Take 1 Tab by mouth daily as needed (As needed for constipation). Indications: CONSTIPATION    HISTORICAL PROVIDER   sertraline (ZOLOFT) 100 mg tablet Take 1 tablet by mouth daily. 04/07/16   Twanna Hy, APRN   trametinib (MEKINIST) 2 mg tablet Take 1 tablet by mouth daily. Take at least 1 hour before or 2 hours after food. 10/17/15   Fredia Sorrow, APRN   zolpidem (AMBIEN) 10 mg tablet Take 1 tablet by mouth at bedtime as needed for Sleep. 09/24/15   Lenell Antu, MD       Drug-drug and drug-food interactions between the patients??? specialty medication and their medication list were assessed and reviewed with the patient.     No significant drug-drug or drug-food interactions were identified.  Dabrafenib may decrease the serum concentrations of diazepam, hydrocodone, ondansetron, and zolpidem. The patient has been stable on these medications for many months, so this is not a concern at this time and they will be managed by continuing to monitor for efficacy of these medications.     The patient was instructed to speak with their health care provider and/or the oral chemotherapy pharmacist before starting any new drug, including prescription or over the counter, natural / herbal products, or vitamins.      No Known Allergies    Reproductive Risk Assessment    Gabriel Robertson is a 66 y.o. male As patient is a male, education was provided regarding adequate contraception for male partners of reproductive potential and contacting his physician immediately should his partner become pregnant.     Risk Evaluation and Mitigation Strategy (REMS) Assessment    No REMS is required for this medication.  Follow-up Plan     The patient was encouraged to call the oral chemotherapy pharmacist at [phone] with questions. This medication is considered medium risk per our internal oral chemotherapy risk categorization and the patient will be contacted for education, toxicity check at 2 weeks, one reassessment at 3 months and then annually, if applicable (medium risk monitoring).      Re-assessment has been completed. Next reassessment planned for 1 year(s).     Oris Drone, [facility]  Oncology Clinical Pharmacist  09/01/2016

## 2016-09-02 ENCOUNTER — Encounter: Admit: 2016-09-02 | Discharge: 2016-09-02 | Payer: MEDICARE

## 2016-09-02 DIAGNOSIS — C7931 Secondary malignant neoplasm of brain: Principal | ICD-10-CM

## 2016-09-02 DIAGNOSIS — C439 Malignant melanoma of skin, unspecified: ICD-10-CM

## 2016-09-02 DIAGNOSIS — C711 Malignant neoplasm of frontal lobe: Principal | ICD-10-CM

## 2016-09-03 ENCOUNTER — Encounter: Admit: 2016-09-03 | Discharge: 2016-09-03 | Payer: MEDICARE

## 2016-09-03 ENCOUNTER — Ambulatory Visit: Admit: 2016-09-03 | Discharge: 2016-09-03 | Payer: MEDICARE

## 2016-09-03 DIAGNOSIS — C714 Malignant neoplasm of occipital lobe: Secondary | ICD-10-CM

## 2016-09-03 DIAGNOSIS — C7931 Secondary malignant neoplasm of brain: Principal | ICD-10-CM

## 2016-09-03 NOTE — Progress Notes
End of Treatment Summary Note    Date:  09/03/2016    Gabriel Robertson is a 66 y.o. male.     There were no encounter diagnoses.  Staging: Cancer Staging  Melanoma ([place])  Staging form: Melanoma of the Skin, AJCC 7th Edition  - Clinical: No stage assigned - Unsigned  - Pathologic: Stage IIIC (T4b, N2b, cM0) - Signed by Sandrea Hughs, PA-C on 05/15/2014      Treatment Data Summary:      Site of treatment: brain 4 lesions, left occipital lobe, right ant. Frontal lobe. Right sup. Frontal lobe and right post/ cerebellar  Date of treatment: 09/03/2016  Dose /fraction: 2000 cGy to each lesion, single fraction  Technique of radiation: SRS. 6 xSRS photon     Treatment Tolerance and Response:  The patient tolerated treatment well without unexpected or significant acute side effects.      Follow Up:  Gabriel Robertson will be scheduled for a follow up with [facility] in three months

## 2016-09-04 NOTE — Procedures
Patient: Gabriel Robertson    Medical record number: [id number]    Procedure: MRI and CT directed stereotactic radiosurgical treatment to 4 intracranial metastatic tumors utilizing the stereotactic mask fixation system    Date of procedure: September 03, 2016    Surgeon: Artis Delay, MD  Assistant surgeon: Pam Drown, MD    Anesthesia: Oral anxiolytic    Clinical summary: [name] is a very nice 66 y.o. male with a history of metastatic malignant melanoma.   MRI examination of the brain demonstrates 4 new small foci of enhancement consistent with metastatic tumors. The risks and possible complications of this procedure have been carefully reviewed.  These include failure to control the growth of the tumor, recurrence of the tumor and the delayed risks of radiation necrosis.  He understands that this type of tissue has a tendency toward hemorrhage.  He also understands that there may be a tendency toward increased likelihood of radiation necrosis in the setting of systemic immunotherapy.  He has also had extensive consultation with Dr. Regino Schultze of the Radiation Oncology department concerning the risks and possible complications of this procedure. He expresses understanding and wishes to proceed.     Description: The preplanning MRI examination of the brain was imported into the computer in the [place] planning center.  The patient was fit with a stereotactic mask and subsequently underwent a noncontrast CT examination of the head.  These images were imported and fused with the preplanning MRI examination of the brain.  The areas to be treated were targeted and the treatment plan was developed.  Based upon the volume and location of the tumors, Dr. Regino Schultze recommended a dose of 20 Gy to each isocenter.  After performing the final checks, the treatment plan was approved by the staff medical physicist, by Dr. Regino Schultze and by me.  The plan was then imported into the computer in the [place] treatment center. On the day of treatment, the patient was pretreated with oral anxiolytic medication.  He reclined on the treatment table and all pressure points were carefully padded. His head was immobilized using the stereotactic mask fixation system.  Prior to performing treatment to each isocenter, patient positioning was verified with ExacTrac and cone beam CT.  A final visual check was also performed prior to initiating the treatment to each isocenter.    The tumors were treated in the following order using the following nomenclature:  1) left occipital  2) right superior frontal  3) right anterior frontal  4) right posterior cerebellar   Immediately upon completion of  treatment to the fourth isocenter, the stereotactic mask was removed.  The patient had no new neurologic deficit as a result of the treatment today and there were no complications.  At the completion of the treatment, he was escorted back to the holding area and was given appropriate discharge and follow-up instructions.  I was present throughout the course the treatment today.

## 2016-09-07 ENCOUNTER — Ambulatory Visit: Admit: 2016-09-07 | Discharge: 2016-09-08 | Payer: MEDICARE

## 2016-09-07 ENCOUNTER — Ambulatory Visit: Admit: 2016-09-07 | Discharge: 2016-09-07 | Payer: MEDICARE

## 2016-09-07 ENCOUNTER — Encounter: Admit: 2016-09-07 | Discharge: 2016-09-07 | Payer: MEDICARE

## 2016-09-07 DIAGNOSIS — I890 Lymphedema, not elsewhere classified: ICD-10-CM

## 2016-09-07 DIAGNOSIS — C711 Malignant neoplasm of frontal lobe: ICD-10-CM

## 2016-09-07 DIAGNOSIS — C773 Secondary and unspecified malignant neoplasm of axilla and upper limb lymph nodes: ICD-10-CM

## 2016-09-07 DIAGNOSIS — C439 Malignant melanoma of skin, unspecified: Principal | ICD-10-CM

## 2016-09-07 DIAGNOSIS — Z923 Personal history of irradiation: ICD-10-CM

## 2016-09-07 DIAGNOSIS — C7931 Secondary malignant neoplasm of brain: ICD-10-CM

## 2016-09-07 LAB — CBC AND DIFF
Lab: 0 10*3/uL (ref 0–0.20)
Lab: 0 10*3/uL (ref 0–0.45)
Lab: 0.2 10*3/uL (ref 0–0.80)
Lab: 12 g/dL — ABNORMAL LOW (ref 13.5–16.5)
Lab: 3.5 10*3/uL — ABNORMAL LOW (ref 4.5–11.0)
Lab: 31 pg (ref 26–34)
Lab: 34 g/dL (ref 32.0–36.0)
Lab: 36 % — ABNORMAL LOW (ref 40–50)
Lab: 90 FL (ref 80–100)

## 2016-09-07 LAB — COMPREHENSIVE METABOLIC PANEL: Lab: 133 MMOL/L — ABNORMAL LOW (ref 137–147)

## 2016-09-07 LAB — LDH-LACTATE DEHYDROGENASE: Lab: 205 U/L — ABNORMAL LOW (ref 100–210)

## 2016-09-07 MED ORDER — SODIUM CHLORIDE 0.9 % IJ SOLN
50 mL | Freq: Once | INTRAVENOUS | 0 refills | Status: CP
Start: 2016-09-07 — End: ?
  Administered 2016-09-07: 15:00:00 50 mL via INTRAVENOUS

## 2016-09-07 MED ORDER — IOHEXOL 350 MG IODINE/ML IV SOLN
100 mL | Freq: Once | INTRAVENOUS | 0 refills | Status: CP
Start: 2016-09-07 — End: ?
  Administered 2016-09-07: 15:00:00 100 mL via INTRAVENOUS

## 2016-09-07 NOTE — Progress Notes
Name: Gabriel Robertson          MRN: [id number]      DOB: [birth year]      AGE: 66 y.o.   DATE OF SERVICE: 09/07/2016    Subjective:             Reason for Visit:  Heme/Onc Care      Gabriel Robertson is a 66 y.o. male.     Cancer Staging  Melanoma (HCC)  Staging form: Melanoma of the Skin, AJCC 7th Edition  - Clinical: No stage assigned - Unsigned  - Pathologic: Stage IIIC (T4b, N2b, cM0) - Signed by Sherril Croon, PA-C on 05/15/2014      History of Present Illness  Gabriel Robertson presents with his wife today for follow up of his metastatic melanoma.  He continues on dabrafenib + trametinib, dabrafenib at reduced dose of 75 mg twice daily and trametinib 2 mg daily. On 8/16 had radiation therapy with Dr. Regino Schultze.    He reports feeling well.  He denies adverse symptoms related to targeted agents. He denies signs or symptoms suggestive of infection. He has mild arthralgias that are unchanged. Has limited ROM in both arms / shoulders, this is post surgical. Some fatigue is present but this is gradually improving.        Review of Systems   Constitutional: Positive for fatigue. Negative for appetite change, chills, fever and unexpected weight change.   HENT: Negative for hearing loss, sore throat and trouble swallowing.    Eyes: Negative for visual disturbance.   Respiratory: Negative for cough, choking, chest tightness and shortness of breath.    Cardiovascular: Negative for chest pain, palpitations and leg swelling.   Gastrointestinal: Negative for abdominal distention, abdominal pain, blood in stool, constipation, diarrhea, nausea and vomiting.   Genitourinary: Negative for dysuria and hematuria.   Musculoskeletal: Positive for arthralgias. Negative for back pain, myalgias and neck stiffness.   Skin: Negative for rash.   Neurological: Negative for dizziness, speech difficulty, weakness, light-headedness, numbness and headaches.   Hematological: Positive for adenopathy (left arm. Continued swollen presentation). Does not bruise/bleed easily.   Psychiatric/Behavioral: Negative for confusion, dysphoric mood and sleep disturbance. The patient is not nervous/anxious.    All other systems reviewed and are negative.        Objective:         ??? aspirin EC 81 mg tablet Take 81 mg by mouth daily.   ??? dabrafenib (TAFINLAR) 75 mg capsule Take 75 mg in the am and 150 mg in the pm   ??? diazepam (VALIUM) 10 mg tablet Take 1 Tab by mouth every 6 hours as needed for Anxiety.   ??? gabapentin (NEURONTIN) 100 mg capsule Take 3 capsules by mouth three times daily.   ??? HYDROcodone/acetaminophen(+) (NORCO) 10/325 mg tablet    ??? levothyroxine (SYNTHROID) 150 mcg tablet Take 1 Tab by mouth daily 30 minutes before breakfast.   ??? LORazepam (ATIVAN) 1 mg tablet Take 1 tablet by mouth every 8 hours as needed for Other.... Take 1-2 tabs 30 minutes prior to CT simulation and radiation treatment   ??? MULTIVITAMIN PO Take  by mouth daily.   ??? ondansetron hcl (ZOFRAN) 8 mg tablet Take 1 Tab by mouth every 8 hours as needed for Nausea or Vomiting.   ??? polyethylene glycol 3350 (MIRALAX) 17 g packet Take 17 g by mouth daily as needed.   ??? senna/docusate (SENOKOT-S) 8.6/50 mg tablet Take 1 Tab by mouth daily as needed (As  needed for constipation). Indications: CONSTIPATION   ??? sertraline (ZOLOFT) 100 mg tablet Take 1 tablet by mouth daily.   ??? trametinib (MEKINIST) 2 mg tablet Take 1 tablet by mouth daily. Take at least 1 hour before or 2 hours after food.   ??? zolpidem (AMBIEN) 10 mg tablet Take 1 tablet by mouth at bedtime as needed for Sleep.     Vitals:    09/07/16 1248   BP: 155/86   Pulse: 56   Resp: 16   Temp: 36.6 ???C (97.8 ???F)   TempSrc: Oral   SpO2: 99%   Weight: 99.8 kg (220 lb)     Body mass index is 27.5 kg/m???.     Pain Score: Six  Pain Loc: Generalized      Pain Addressed:  Current regimen working to control pain.     Patient Evaluated for a Clinical Trial: Patient not eligible for a treatment trial (including not needing treatment, needs palliative care, in remission).     [place] Cooperative Oncology Group performance status is 0, Fully active, able to carry on all pre-disease performance without restriction.Marland Kitchen     Physical Exam   Constitutional: He is oriented to person, place, and time. He appears well-developed and well-nourished.   HENT:   Head: Normocephalic and atraumatic.   Eyes: EOM are normal. Pupils are equal, round, and reactive to light.   Neck: Normal range of motion.   Cardiovascular: Normal rate, regular rhythm and normal heart sounds.    Pulmonary/Chest: Effort normal and breath sounds normal. No respiratory distress. He has no wheezes.   Abdominal: Soft. Bowel sounds are normal. There is no hepatosplenomegaly, splenomegaly or hepatomegaly.   Musculoskeletal: Normal range of motion. He exhibits no edema or tenderness.   Lymphadenopathy:        Head (right side): No submental and no submandibular adenopathy present.        Head (left side): No submental and no submandibular adenopathy present.     He has axillary adenopathy.        Left axillary: Pectoral adenopathy present.        Right: No inguinal and no supraclavicular adenopathy present.        Left: No inguinal and no supraclavicular adenopathy present.   Very raised. Result of surgery   Neurological: He is alert and oriented to person, place, and time.   Skin: Skin is warm, dry and intact. No rash noted.   Noted disease appears to be regressing on surface. Picture in chart    Psychiatric: He has a normal mood and affect. His behavior is normal. Judgment and thought content normal. Cognition and memory are normal.   Vitals reviewed.       CBC w/Diff    Lab Results   Component Value Date/Time    WBC 3.5 (L) 09/07/2016 12:04 PM    RBC 3.97 (L) 09/07/2016 12:04 PM    HGB 12.5 (L) 09/07/2016 12:04 PM    HCT 36.1 (L) 09/07/2016 12:04 PM    MCV 90.7 09/07/2016 12:04 PM    MCH 31.6 09/07/2016 12:04 PM MCHC 34.8 09/07/2016 12:04 PM    RDW 14.1 09/07/2016 12:04 PM    PLTCT 189 09/07/2016 12:04 PM    MPV 7.8 09/07/2016 12:04 PM    Lab Results   Component Value Date/Time    NEUT 63 09/07/2016 12:04 PM    ANC 2.20 09/07/2016 12:04 PM    LYMA 29 09/07/2016 12:04 PM    ALC 1.00 09/07/2016 12:04  PM    MONA 6 09/07/2016 12:04 PM    AMC 0.20 09/07/2016 12:04 PM    EOSA 1 09/07/2016 12:04 PM    AEC 0.00 09/07/2016 12:04 PM    BASA 1 09/07/2016 12:04 PM    ABC 0.00 09/07/2016 12:04 PM          Comprehensive Metabolic Profile    Lab Results   Component Value Date/Time    NA 133 (L) 09/07/2016 12:04 PM    K 3.7 09/07/2016 12:04 PM    CL 100 09/07/2016 12:04 PM    CO2 30 09/07/2016 12:04 PM    GAP 3 09/07/2016 12:04 PM    BUN 16 09/07/2016 12:04 PM    CR 0.98 09/07/2016 12:04 PM    GLU 112 (H) 09/07/2016 12:04 PM    Lab Results   Component Value Date/Time    CA 8.9 09/07/2016 12:04 PM    PO4 2.7 02/15/2015 04:35 PM    ALBUMIN 3.8 09/07/2016 12:04 PM    TOTPROT 6.7 09/07/2016 12:04 PM    ALKPHOS 56 09/07/2016 12:04 PM    AST 28 09/07/2016 12:04 PM    ALT 17 09/07/2016 12:04 PM    TOTBILI 0.4 09/07/2016 12:04 PM    GFR >60 09/07/2016 12:04 PM    GFRAA >60 09/07/2016 12:04 PM          Other labs    No results found for: ESR Lab Results   Component Value Date/Time    LDH 205 09/07/2016 12:04 PM        Results for orders placed during the hospital encounter of 08/20/16   MRI HEAD WO/W CONTRAST    Narrative MRI BRAIN     HISTORY: 66 year old male, brain metastasis, malignant melanoma, unspecified site    TECHNIQUE: Multiplanar and multisequence MR imaging of the head was performed. This was done both before and after the administration of gadolinium contrast.    COMPARISON: MRI brain May 06, 2016     FINDINGS:     No significant change in size of small 4 mm right frontal lobe and tiny 2 mm left precentral gyrus enhancing metastases (series 11 images 145 and 189). Development of a new tiny 1 to 2 mm cortically based enhancing lesion within the right deep precentral sulcus (series 11 image 186). Additionally there is been development of or increased conspicuity of two small enhancing right cerebellar lesions (series 11 image 58 and 38).    Additional small area of enhancement within the right central sulcus (series 11 image 181) is felt to be vascular in nature.    The ventricles and subarachnoid spaces are normal in size and configuration. Brain parenchyma is otherwise normal in signal. There is no midline shift or mass effect. The vascular flow-voids are unremarkable. Diffusion weighted imaging is not indicative of acute or recent infarct. Patchy signal intensity and focal linear enhancement within the left upper clivus is unchanged (series 11 image 54, series 7 image 6).      Impression 1.  Stable small right frontal lobe and left precentral gyrus enhancing metastases.  2.  Development of a tiny right deep precentral sulcus and two small right cerebellar enhancing lesions concerning for additional metastases.       Approved by Elfredia Nevins, M.D. on 08/20/2016 11:51 AM    By my electronic signature, I attest that I have personally reviewed the images for this examination and formulated the interpretations and opinions expressed in this report  Finalized by Dimple Casey, M.D. on 08/20/2016 12:08 PM. Dictated by Elfredia Nevins, M.D. on 08/20/2016 11:19 AM.       Results for orders placed during the hospital encounter of 09/07/16   CT CHEST W CONTRAST    Narrative CT CHEST, ABDOMEN AND PELVIS    Clinical Indication:  Male, 66 years old. Evaluation of disease. Multiple melanoma, unspecified site.    Technique: Multiple contiguous axial images were obtained through the chest and abdomen following the administration of IV contrast material. Hepatic arterial, portal venous and delayed phases were obtained. Post processing coronal and sagittal reconstruction images were made from the axial images.     Interpretation moderately compromised by inability of patient place arms overhead.    IV contrast: Omnipaque 350  Bowel contrast:  None    Comparison: CT chest abdomen pelvis dated 05/21/2016, 03/03/2016, 12/10/2015, 09/24/2015.    CHEST FINDINGS:    Lower Neck: Unremarkable.    Axilla, Mediastinum and Hila: Prior bilateral axillary lymph node dissection. Slight interval decrease in size of left axillary fluid collection now measuring 7 cm in maximal diameter (series 2 image 115), compared with 7.4 cm previously.    Heart and Great Vessels: The heart is normal size. Persistent mild ectasia of the ascending aorta..    Airway, Lungs and Pleura: Central airways are patent. No pleural effusion, or focal consolidating pulmonary lesions. Unchanged pleural parenchymal scarring/fibrosis.    Chest Wall and Osseous Structures: No aggressive soft tissue lesions. Multiple old bilateral rib fracture deformities. Moderate thoracic spondylosis. No aggressive osseous lesions.     ABDOMEN FINDINGS:    Liver and Biliary system: Small liver metastases are less well-seen on today's evaluation due to to scatter artifact from patient's arms. The liver is normal size. Persistent scattered low-density hepatic lesions. A reference lesion in segment IVb now measures 1.3 cm (series 2 image 394) compared with 1.4 cm previously. A second segment 6 reference lesion measures 1 cm (series 2 image 411) compared with 1.3 cm previously. The remaining lesions are too small to characterize. The major portal veins are patent. No biliary ductal dilation. The gallbladder is unremarkable.     Spleen: Unremarkable. No focal splenic lesions.    Adrenal Glands and Kidneys: The adrenal glands are unremarkable. Kidneys are unremarkable. No hydronephrosis, no nephrolithiasis.    Pancreas and Retroperitoneum: The pancreas is unremarkable. No retroperitoneal adenopathy. Aorta and Major Vessels: The abdominal aorta is normal caliber. Mild calcific atherosclerosis.     Bowel, Mesentery and Peritoneal space: The large and small bowel are normal caliber. No mesenteric adenopathy. No ascites.    Abdominal wall and Osseous Structures: No aggressive soft tissue or osseous lesions. Moderate thoracolumbar spondylosis.      Impression CHEST:  1.  No thoracic metastatic disease.    2.  Slight interval decrease in size of left axillary fluid collection.       ABDOMEN:  1.  No significant change in hepatic metastases.    2.  Compromised exam as a result of streak artifact secondary to patient's inability to raise the arms overhead.       Approved by Rubbie Battiest, D.O. on 09/07/2016 12:01 PM    By my electronic signature, I attest that I have personally reviewed the images for this examination and formulated the interpretations and opinions expressed in this report       Finalized by Merlene Laughter, M.D. on 09/07/2016 12:02 PM. Dictated by Rubbie Battiest, D.O. on 09/07/2016 10:15 AM.  Assessment and Plan:  Problem   Melanoma (Hcc)    DIAGNOSIS: Melanoma  BRAF STATUS: POSITIVE  PAST THERAPY: S/P excisional biopsy of mid back lesion (03/19/2014) revealing malignant melanoma, superficial spreading, 5mm, with ulceration, mitotic index 5/mm2. Regression, microsatellitosis, perineural invasion, and tumor infiltrating lymphocytes were all present. Suspicious for lymphovascular invasion. He underwent WLE and bilateral SLN biopsy (05/04/2014). Negative for residual melanoma with 1/2 right axillary sentinel LNs positive for metastatic melanoma and 3/5 left axillary sentinel LNs positive. No extracapsular extension noted. MRI brain & PET (05/17/2014) were negative for distant metastatic disease. S/P bilateral completion axillary lymphadenectomies (06/30/2014). Pathology demonstrated metastatic melanoma in 3/10 right axillary nodes with multiple foci of melanoma involving the perinodal soft tissue. There was no evidence of melanoma in 11 left axillary nodes; however, there was metastatic melanoma noted involving the perinodal soft tissue on the left too. He completed radiation therapy to the bilateral axillae and back (08/2014). 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 S/P Keytruda x 3 cycles, stopped secondary to disease progression. He was then started on dabrafenib + trametinib (01/2015). The dabrafenib was dose-reduced to 75mg  BID secondary to neutropenia (02/26/2015), and then stopped secondary to fevers (03/04/2015)  PRESENT THERAPY: Dabrafenib 75mg  twice daily + trametinib 2mg  daily  Patient consented for the Melanoma Survivorship Registry 06/08/2016         Melanoma [facility])  Scans display stable disease. 1-63mm lesions noted in brain, will monitor. Receiving radiation with Dr. Regino Schultze     -Continue on dabrafenib + trametinib, dabrafenib at reduced dose of 75 mg twice daily and trametinib 2 mg daily.   -Noted surface disease throughout left chest, under arm, to left back appears to be shrinking.   -Continue to follow with lymphedema specialist. Swelling is notably down at this time. Wraps and pumps have been very helpful.   -Continued follow with Dr. Regino Schultze. Scheduled for 11/15 with MRI and treatment.   -f/u in 1 month with labs and Kristen.   -Patient will follow up in the interim should they develop adverse symptoms. ???Patient verbalized understanding and agrees to plan of care.    Total time:  25 minutes, with > 50% time spent counseling with regard to disease status, treatment options, necessity for further testing, and outlining a follow up plan.  This conversation was had with pt and wife.     The patient was seen and examined by Dr. Adora Fridge who formulated the above assessment and plan. Patricia Pesa. Lomenick, acting as Neurosurgeon. I have seen and examined this patient.    I've reviewed the pertinent portions of the history as well as laboratory studies and radiology studies.    I agree with the assessment and plan as documented in this note.    Tyson Dense Merla Riches, MD

## 2016-09-10 ENCOUNTER — Encounter: Admit: 2016-09-10 | Discharge: 2016-09-10 | Payer: MEDICARE

## 2016-09-10 NOTE — Telephone Encounter
SPOKE TO PT TO R/S HIS APPT WITH KRISTEN BURKETT FROM 9/24 TO 9/25 DUE TO CLINIC FREEZE.  PT UNDERSTOOD AND AGREED TO DATE AND TIME CHANGE

## 2016-09-18 ENCOUNTER — Ambulatory Visit: Admit: 2016-09-03 | Discharge: 2016-09-18 | Payer: MEDICARE

## 2016-09-18 DIAGNOSIS — C439 Malignant melanoma of skin, unspecified: Principal | ICD-10-CM

## 2016-09-18 DIAGNOSIS — C711 Malignant neoplasm of frontal lobe: ICD-10-CM

## 2016-09-18 DIAGNOSIS — C7931 Secondary malignant neoplasm of brain: ICD-10-CM

## 2016-09-18 DIAGNOSIS — C714 Malignant neoplasm of occipital lobe: ICD-10-CM

## 2016-09-18 DIAGNOSIS — I890 Lymphedema, not elsewhere classified: Principal | ICD-10-CM

## 2016-09-18 DIAGNOSIS — L599 Disorder of the skin and subcutaneous tissue related to radiation, unspecified: ICD-10-CM

## 2016-09-18 DIAGNOSIS — C716 Malignant neoplasm of cerebellum: ICD-10-CM

## 2016-09-18 DIAGNOSIS — L905 Scar conditions and fibrosis of skin: ICD-10-CM

## 2016-10-13 ENCOUNTER — Encounter: Admit: 2016-10-13 | Discharge: 2016-10-13 | Payer: MEDICARE

## 2016-10-13 DIAGNOSIS — C4359 Malignant melanoma of other part of trunk: Principal | ICD-10-CM

## 2016-10-13 DIAGNOSIS — C711 Malignant neoplasm of frontal lobe: Principal | ICD-10-CM

## 2016-10-13 DIAGNOSIS — C439 Malignant melanoma of skin, unspecified: ICD-10-CM

## 2016-10-13 DIAGNOSIS — F329 Major depressive disorder, single episode, unspecified: ICD-10-CM

## 2016-10-13 DIAGNOSIS — F419 Anxiety disorder, unspecified: ICD-10-CM

## 2016-10-13 DIAGNOSIS — R202 Paresthesia of skin: ICD-10-CM

## 2016-10-13 DIAGNOSIS — E785 Hyperlipidemia, unspecified: ICD-10-CM

## 2016-10-13 DIAGNOSIS — I10 Essential (primary) hypertension: ICD-10-CM

## 2016-10-13 DIAGNOSIS — C61 Malignant neoplasm of prostate: Principal | ICD-10-CM

## 2016-10-13 DIAGNOSIS — S129XXA Fracture of neck, unspecified, initial encounter: ICD-10-CM

## 2016-10-13 DIAGNOSIS — R5383 Other fatigue: ICD-10-CM

## 2016-10-13 DIAGNOSIS — R002 Palpitations: ICD-10-CM

## 2016-10-13 DIAGNOSIS — H9190 Unspecified hearing loss, unspecified ear: ICD-10-CM

## 2016-10-13 DIAGNOSIS — M1990 Unspecified osteoarthritis, unspecified site: ICD-10-CM

## 2016-10-13 DIAGNOSIS — K219 Gastro-esophageal reflux disease without esophagitis: ICD-10-CM

## 2016-10-13 DIAGNOSIS — E039 Hypothyroidism, unspecified: ICD-10-CM

## 2016-10-13 LAB — COMPREHENSIVE METABOLIC PANEL
Lab: 0.4 mg/dL (ref 0.3–1.2)
Lab: 1 mg/dL (ref 0.4–1.24)
Lab: 136 MMOL/L — ABNORMAL LOW (ref 137–147)
Lab: 21 mg/dL (ref 7–25)
Lab: 22 U/L (ref 7–56)
Lab: 30 MMOL/L (ref 21–30)
Lab: 35 U/L (ref 7–40)
Lab: 4 K/UL (ref 3–12)
Lab: 4 MMOL/L — ABNORMAL LOW (ref 3.5–5.1)
Lab: 4 g/dL (ref 3.5–5.0)
Lab: 7 g/dL (ref 6.0–8.0)
Lab: 74 U/L (ref 25–110)
Lab: 82 mg/dL (ref 70–100)
Lab: 9.3 mg/dL (ref 8.5–10.6)
Lab: >60 mL/min (ref >60)
Lab: >60 mL/min (ref >60)

## 2016-10-13 LAB — CBC AND DIFF
Lab: 0 10*3/uL (ref 0–0.20)
Lab: 3.5 10*3/uL — ABNORMAL LOW (ref 4.5–11.0)
Lab: 3.9 M/UL — ABNORMAL LOW (ref 4.4–5.5)

## 2016-10-13 LAB — LDH-LACTATE DEHYDROGENASE: Lab: 254 U/L — ABNORMAL HIGH (ref 100–210)

## 2016-10-13 NOTE — Assessment & Plan Note
No progressive disease by history or exam  Will restage in 8 weeks

## 2016-10-13 NOTE — Assessment & Plan Note
Coping reasonably well

## 2016-10-13 NOTE — Progress Notes
Name: Gabriel Robertson          MRN: [id number]      DOB: 07/16/1950      AGE: 66 y.o.   DATE OF SERVICE: 10/13/2016    Subjective:             Reason for Visit:  Heme/Onc Care      Gabriel Robertson is a 66 y.o. male.     Cancer Staging  Melanoma (HCC)  Staging form: Melanoma of the Skin, AJCC 7th Edition  - Clinical: No stage assigned - Unsigned  - Pathologic: Stage IIIC (T4b, N2b, cM0) - Signed by Sherril Croon, PA-C on 05/15/2014      History of Present Illness    Gabriel Robertson presents with his wife today for follow up of his metastatic melanoma.  He continues on dabrafenib + trametinib, dabrafenib at reduced dose of 75 mg twice daily and trametinib 2 mg daily.   He feels well today.  He continues to tolerate targeted agents.  He denies signs or symptoms suggestive of infection.   He continues to have lymphedema to upper extremities.         Review of Systems   Constitutional: Positive for fatigue. Negative for appetite change, chills, fever and unexpected weight change.   HENT: Negative for hearing loss, sore throat and trouble swallowing.    Eyes: Negative for visual disturbance.   Respiratory: Negative for cough, choking, chest tightness and shortness of breath.    Cardiovascular: Negative for chest pain, palpitations and leg swelling.   Gastrointestinal: Negative for abdominal distention, abdominal pain, blood in stool, constipation, diarrhea, nausea and vomiting.   Genitourinary: Negative for dysuria and hematuria.   Musculoskeletal: Positive for arthralgias. Negative for back pain, myalgias and neck stiffness.   Skin: Negative for rash.   Neurological: Negative for dizziness, speech difficulty, weakness, light-headedness, numbness and headaches.   Hematological: Positive for adenopathy (left arm. Continued swollen presentation). Does not bruise/bleed easily.   Psychiatric/Behavioral: Negative for confusion, dysphoric mood and sleep disturbance. The patient is not nervous/anxious. All other systems reviewed and are negative.        Objective:         ??? aspirin EC 81 mg tablet Take 81 mg by mouth daily.   ??? dabrafenib (TAFINLAR) 75 mg capsule Take 75 mg in the am and 150 mg in the pm   ??? diazepam (VALIUM) 10 mg tablet Take 1 Tab by mouth every 6 hours as needed for Anxiety.   ??? gabapentin (NEURONTIN) 100 mg capsule Take 3 capsules by mouth three times daily.   ??? HYDROcodone/acetaminophen(+) (NORCO) 10/325 mg tablet    ??? levothyroxine (SYNTHROID) 150 mcg tablet Take 1 Tab by mouth daily 30 minutes before breakfast.   ??? LORazepam (ATIVAN) 1 mg tablet Take 1 tablet by mouth every 8 hours as needed for Other.... Take 1-2 tabs 30 minutes prior to CT simulation and radiation treatment   ??? MULTIVITAMIN PO Take  by mouth daily.   ??? ondansetron hcl (ZOFRAN) 8 mg tablet Take 1 Tab by mouth every 8 hours as needed for Nausea or Vomiting.   ??? polyethylene glycol 3350 (MIRALAX) 17 g packet Take 17 g by mouth daily as needed.   ??? senna/docusate (SENOKOT-S) 8.6/50 mg tablet Take 1 Tab by mouth daily as needed (As needed for constipation). Indications: CONSTIPATION   ??? sertraline (ZOLOFT) 100 mg tablet Take 1 tablet by mouth daily.   ??? trametinib (MEKINIST) 2 mg tablet Take  1 tablet by mouth daily. Take at least 1 hour before or 2 hours after food.   ??? zolpidem (AMBIEN) 10 mg tablet Take 1 tablet by mouth at bedtime as needed for Sleep.     Vitals:    10/13/16 1323   BP: 157/85   Pulse: 52   Temp: 36.8 ???C (98.2 ???F)   TempSrc: Oral   SpO2: 99%   Weight: 99.2 kg (218 lb 9.6 oz)   Height: 190.5 cm (75)     Body mass index is 27.32 kg/m???.     Pain Score: Seven  Pain Loc: Generalized      Pain Addressed:  Current regimen working to control pain.     Patient Evaluated for a Clinical Trial: Patient not eligible for a treatment trial (including not needing treatment, needs palliative care, in remission).     [place] Cooperative Oncology Group performance status is 0, Fully active, able to carry on all pre-disease performance without restriction.Marland Kitchen     Physical Exam   Constitutional: He is oriented to person, place, and time. He appears well-developed and well-nourished.   HENT:   Head: Normocephalic and atraumatic.   Eyes: Pupils are equal, round, and reactive to light. EOM are normal.   Neck: Normal range of motion.   Cardiovascular: Normal rate, regular rhythm and normal heart sounds.    Pulmonary/Chest: Effort normal and breath sounds normal. No respiratory distress. He has no wheezes.   Abdominal: Soft. Bowel sounds are normal. There is no hepatosplenomegaly, splenomegaly or hepatomegaly.   Musculoskeletal: Normal range of motion. He exhibits no edema or tenderness.   Lymphadenopathy:        Head (right side): No submental and no submandibular adenopathy present.        Head (left side): No submental and no submandibular adenopathy present.     He has axillary adenopathy.        Left axillary: Pectoral adenopathy present.        Right: No inguinal and no supraclavicular adenopathy present.        Left: No inguinal and no supraclavicular adenopathy present.   Very raised. Result of surgery   Neurological: He is alert and oriented to person, place, and time.   Skin: Skin is warm, dry and intact. No rash noted.   Noted disease appears to be regressing on surface. Picture in chart    Psychiatric: He has a normal mood and affect. His behavior is normal. Judgment and thought content normal. Cognition and memory are normal.   Vitals reviewed.       CBC w/Diff    Lab Results   Component Value Date/Time    WBC 3.5 (L) 10/13/2016 12:16 PM    RBC 3.99 (L) 10/13/2016 12:16 PM    HGB 12.8 (L) 10/13/2016 12:16 PM    HCT 36.9 (L) 10/13/2016 12:16 PM    MCV 92.5 10/13/2016 12:16 PM    MCH 32.1 10/13/2016 12:16 PM    MCHC 34.7 10/13/2016 12:16 PM    RDW 14.3 10/13/2016 12:16 PM    PLTCT 205 10/13/2016 12:16 PM    MPV 7.3 10/13/2016 12:16 PM    Lab Results   Component Value Date/Time NEUT 58 10/13/2016 12:16 PM    ANC 2.10 10/13/2016 12:16 PM    LYMA 31 10/13/2016 12:16 PM    ALC 1.10 10/13/2016 12:16 PM    MONA 8 10/13/2016 12:16 PM    AMC 0.30 10/13/2016 12:16 PM    EOSA 2 10/13/2016 12:16  PM    AEC 0.10 10/13/2016 12:16 PM    BASA 1 10/13/2016 12:16 PM    ABC 0.00 10/13/2016 12:16 PM          Comprehensive Metabolic Profile    Lab Results   Component Value Date/Time    NA 136 (L) 10/13/2016 12:16 PM    K 4.0 10/13/2016 12:16 PM    CL 102 10/13/2016 12:16 PM    CO2 30 10/13/2016 12:16 PM    GAP 4 10/13/2016 12:16 PM    BUN 21 10/13/2016 12:16 PM    CR 1.01 10/13/2016 12:16 PM    GLU 82 10/13/2016 12:16 PM    Lab Results   Component Value Date/Time    CA 9.3 10/13/2016 12:16 PM    PO4 2.7 02/15/2015 04:35 PM    ALBUMIN 4.0 10/13/2016 12:16 PM    TOTPROT 7.0 10/13/2016 12:16 PM    ALKPHOS 74 10/13/2016 12:16 PM    AST 35 10/13/2016 12:16 PM    ALT 22 10/13/2016 12:16 PM    TOTBILI 0.4 10/13/2016 12:16 PM    GFR >60 10/13/2016 12:16 PM    GFRAA >60 10/13/2016 12:16 PM          Other labs    No results found for: ESR Lab Results   Component Value Date/Time    LDH 254 (H) 10/13/2016 12:16 PM                 Assessment and Plan:  Problem   Fatigue   Anxiety and Depression   Melanoma (Hcc)    DIAGNOSIS: Melanoma  BRAF STATUS: POSITIVE  PAST THERAPY: S/P excisional biopsy of mid back lesion (03/19/2014) revealing malignant melanoma, superficial spreading, 5mm, with ulceration, mitotic index 5/mm2. Regression, microsatellitosis, perineural invasion, and tumor infiltrating lymphocytes were all present. Suspicious for lymphovascular invasion. He underwent WLE and bilateral SLN biopsy (05/04/2014). Negative for residual melanoma with 1/2 right axillary sentinel LNs positive for metastatic melanoma and 3/5 left axillary sentinel LNs positive. No extracapsular extension noted. MRI brain & PET (05/17/2014) were negative for distant metastatic disease. S/P bilateral completion axillary lymphadenectomies (06/30/2014). Pathology demonstrated metastatic melanoma in 3/10 right axillary nodes with multiple foci of melanoma involving the perinodal soft tissue. There was no evidence of melanoma in 11 left axillary nodes; however, there was metastatic melanoma noted involving the perinodal soft tissue on the left too. He completed radiation therapy to the bilateral axillae and back (08/2014). 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 S/P Keytruda x 3 cycles, stopped secondary to disease progression. He was then started on dabrafenib + trametinib (01/2015). The dabrafenib was dose-reduced to 75mg  BID secondary to neutropenia (02/26/2015), and then stopped secondary to fevers (03/04/2015)  PRESENT THERAPY: Dabrafenib 75mg  twice daily + trametinib 2mg  daily  Patient consented for the Melanoma Survivorship Registry 06/08/2016         Melanoma (HCC)  No progressive disease by history or exam  Will restage in 8 weeks    Anxiety and depression  Coping reasonably well    Fatigue  Grade 1       PLAN  -Continue on dabrafenib 75 mg twice daily in addition to 2 mg daily  -RTC in 4 weeks with CBC, CMP prior  -Will plan for echo and CT's in 8 weeks  -Encouraged close F/U in the interim as needed.  Gabriel Robertson and his wife verbalized understanding and agrees  with plan as outlined above      Twanna Hy, ARNP

## 2016-10-13 NOTE — Assessment & Plan Note
Grade 1

## 2016-11-10 ENCOUNTER — Encounter: Admit: 2016-11-10 | Discharge: 2016-11-10 | Payer: MEDICARE

## 2016-11-10 DIAGNOSIS — R5383 Other fatigue: ICD-10-CM

## 2016-11-10 DIAGNOSIS — C4359 Malignant melanoma of other part of trunk: Principal | ICD-10-CM

## 2016-11-10 DIAGNOSIS — C439 Malignant melanoma of skin, unspecified: ICD-10-CM

## 2016-11-10 DIAGNOSIS — F419 Anxiety disorder, unspecified: ICD-10-CM

## 2016-11-10 DIAGNOSIS — C7931 Secondary malignant neoplasm of brain: ICD-10-CM

## 2016-11-10 DIAGNOSIS — F329 Major depressive disorder, single episode, unspecified: ICD-10-CM

## 2016-11-10 LAB — COMPREHENSIVE METABOLIC PANEL
Lab: 0.4 mg/dL (ref 0.3–1.2)
Lab: 1 mg/dL (ref 0.4–1.24)
Lab: 100 mg/dL (ref 70–100)
Lab: 103 MMOL/L (ref 98–110)
Lab: 136 MMOL/L — ABNORMAL LOW (ref 137–147)
Lab: 18 U/L (ref 7–56)
Lab: 20 mg/dL (ref 7–25)
Lab: 3 K/UL (ref 3–12)
Lab: 3.9 g/dL (ref 3.5–5.0)
Lab: 30 MMOL/L (ref 21–30)
Lab: 32 U/L (ref 7–40)
Lab: 4.5 MMOL/L — ABNORMAL LOW (ref 3.5–5.1)
Lab: 6.8 g/dL — ABNORMAL LOW (ref 6.0–8.0)
Lab: 64 U/L (ref 25–110)
Lab: 9.4 mg/dL (ref 8.5–10.6)
Lab: >60 mL/min (ref >60)
Lab: >60 mL/min (ref >60)

## 2016-11-10 LAB — CBC AND DIFF
Lab: 0 10*3/uL (ref 0–0.20)
Lab: 3.6 10*3/uL — ABNORMAL LOW (ref 4.5–11.0)
Lab: 3.8 M/UL — ABNORMAL LOW (ref 4.4–5.5)

## 2016-11-10 NOTE — Assessment & Plan Note
No progressive disease by history or exam  Will restage in 4 weeks

## 2016-11-10 NOTE — Assessment & Plan Note
Grade 1, stable

## 2016-11-10 NOTE — Assessment & Plan Note
Managing for the most part

## 2016-11-10 NOTE — Progress Notes
Name: Gabriel Robertson          MRN: [id number]      DOB: Jan 10, 1951      AGE: 66 y.o.   DATE OF SERVICE: 11/10/2016    Subjective:             Reason for Visit:  Heme/Onc Care      Gabriel Robertson is a 66 y.o. male.     Cancer Staging  Melanoma (HCC)  Staging form: Melanoma of the Skin, AJCC 7th Edition  - Clinical: No stage assigned - Unsigned  - Pathologic: Stage IIIC (T4b, N2b, cM0) - Signed by Sherril Croon, PA-C on 05/15/2014      History of Present Illness    Gabriel Robertson presents with his wife today for follow up of his metastatic melanoma.  He continues on dabrafenib + trametinib, dabrafenib at reduced dose of 75 mg twice daily and trametinib 2 mg daily.   He denies adverse symptoms from his targeted agents.    He reports new right hip pain.  He saw his PCP for this and had xrays done.  He has follow up with orthopedist tomorrow.    He denies signs or symptoms suggestive of infection.   He continues to have fatigue, this is unchanged.        Review of Systems   Constitutional: Positive for fatigue. Negative for appetite change, chills, fever and unexpected weight change.   HENT: Negative for hearing loss, sore throat and trouble swallowing.    Eyes: Negative for visual disturbance.   Respiratory: Negative for cough, choking, chest tightness and shortness of breath.    Cardiovascular: Negative for chest pain, palpitations and leg swelling.   Gastrointestinal: Positive for constipation. Negative for abdominal distention, abdominal pain, blood in stool, diarrhea, nausea and vomiting.   Genitourinary: Negative for dysuria and hematuria.   Musculoskeletal: Positive for arthralgias. Negative for back pain, myalgias and neck stiffness.        Right hip pain    Skin: Negative for rash.   Neurological: Negative for dizziness, speech difficulty, weakness, light-headedness, numbness and headaches.   Hematological: Does not bruise/bleed easily.   Psychiatric/Behavioral: Negative for confusion, dysphoric mood and sleep disturbance. The patient is not nervous/anxious.    All other systems reviewed and are negative.        Objective:         ??? aspirin EC 81 mg tablet Take 81 mg by mouth daily.   ??? celecoxib (CELEBREX) 200 mg capsule    ??? dabrafenib (TAFINLAR) 75 mg capsule Take 75 mg in the am and 150 mg in the pm   ??? diazepam (VALIUM) 10 mg tablet Take 1 Tab by mouth every 6 hours as needed for Anxiety.   ??? gabapentin (NEURONTIN) 100 mg capsule Take 3 capsules by mouth three times daily.   ??? HYDROcodone/acetaminophen(+) (NORCO) 10/325 mg tablet    ??? levothyroxine (SYNTHROID) 150 mcg tablet Take 1 Tab by mouth daily 30 minutes before breakfast.   ??? MULTIVITAMIN PO Take  by mouth daily.   ??? polyethylene glycol 3350 (MIRALAX) 17 g packet Take 17 g by mouth daily as needed.   ??? senna/docusate (SENOKOT-S) 8.6/50 mg tablet Take 1 Tab by mouth daily as needed (As needed for constipation). Indications: CONSTIPATION   ??? sertraline (ZOLOFT) 100 mg tablet Take 1 tablet by mouth daily.   ??? trametinib (MEKINIST) 2 mg tablet Take 1 tablet by mouth daily. Take at least 1 hour before or  2 hours after food.     Vitals:    11/10/16 1301   BP: 140/86   Pulse: 51   Resp: 16   Temp: 36.9 ???C (98.5 ???F)   TempSrc: Oral   SpO2: 100%   Weight: 100.2 kg (220 lb 12.8 oz)     Body mass index is 27.6 kg/m???.     Pain Score: Eight  Pain Loc: Generalized      Pain Addressed:  Current regimen working to control pain.     Patient Evaluated for a Clinical Trial: Patient not eligible for a treatment trial (including not needing treatment, needs palliative care, in remission).     [place] Cooperative Oncology Group performance status is 0, Fully active, able to carry on all pre-disease performance without restriction.Marland Kitchen     Physical Exam   Constitutional: He is oriented to person, place, and time. He appears well-developed and well-nourished.   HENT:   Head: Normocephalic and atraumatic.   Eyes: Pupils are equal, round, and reactive to light. EOM are normal. Neck: Normal range of motion.   Cardiovascular: Normal rate, regular rhythm and normal heart sounds.    Pulmonary/Chest: Effort normal and breath sounds normal. No respiratory distress. He has no wheezes.   Abdominal: Soft. Bowel sounds are normal. There is no hepatosplenomegaly, splenomegaly or hepatomegaly.   Musculoskeletal: Normal range of motion. He exhibits no edema or tenderness.   Lymphadenopathy:        Head (right side): No submental and no submandibular adenopathy present.        Head (left side): No submental and no submandibular adenopathy present.     He has axillary adenopathy.        Left axillary: Pectoral adenopathy present.        Right: No inguinal and no supraclavicular adenopathy present.        Left: No inguinal and no supraclavicular adenopathy present.   Left axilla with pronounced fluid collection, unchanged   Neurological: He is alert and oriented to person, place, and time.   Skin: Skin is warm, dry and intact. No rash noted.   Black macular lesions to right chest that wraps around to right scapular region, less pigmented    Psychiatric: He has a normal mood and affect. His behavior is normal. Judgment and thought content normal. Cognition and memory are normal.   Vitals reviewed.       CBC w/Diff    Lab Results   Component Value Date/Time    WBC 3.6 (L) 11/10/2016 12:15 PM    RBC 3.80 (L) 11/10/2016 12:15 PM    HGB 12.0 (L) 11/10/2016 12:15 PM    HCT 35.2 (L) 11/10/2016 12:15 PM    MCV 92.6 11/10/2016 12:15 PM    MCH 31.5 11/10/2016 12:15 PM    MCHC 34.1 11/10/2016 12:15 PM    RDW 14.4 11/10/2016 12:15 PM    PLTCT 192 11/10/2016 12:15 PM    MPV 6.9 (L) 11/10/2016 12:15 PM    Lab Results   Component Value Date/Time    NEUT 59 11/10/2016 12:15 PM    ANC 2.20 11/10/2016 12:15 PM    LYMA 31 11/10/2016 12:15 PM    ALC 1.10 11/10/2016 12:15 PM    MONA 8 11/10/2016 12:15 PM    AMC 0.30 11/10/2016 12:15 PM    EOSA 1 11/10/2016 12:15 PM    AEC 0.00 11/10/2016 12:15 PM BASA 1 11/10/2016 12:15 PM    ABC 0.00 11/10/2016 12:15 PM  Comprehensive Metabolic Profile    Lab Results   Component Value Date/Time    NA 136 (L) 11/10/2016 12:15 PM    K 4.5 11/10/2016 12:15 PM    CL 103 11/10/2016 12:15 PM    CO2 30 11/10/2016 12:15 PM    GAP 3 11/10/2016 12:15 PM    BUN 20 11/10/2016 12:15 PM    CR 1.00 11/10/2016 12:15 PM    GLU 100 11/10/2016 12:15 PM    Lab Results   Component Value Date/Time    CA 9.4 11/10/2016 12:15 PM    PO4 2.7 02/15/2015 04:35 PM    ALBUMIN 3.9 11/10/2016 12:15 PM    TOTPROT 6.8 11/10/2016 12:15 PM    ALKPHOS 64 11/10/2016 12:15 PM    AST 32 11/10/2016 12:15 PM    ALT 18 11/10/2016 12:15 PM    TOTBILI 0.4 11/10/2016 12:15 PM    GFR >60 11/10/2016 12:15 PM    GFRAA >60 11/10/2016 12:15 PM          Other labs    No results found for: ESR Lab Results   Component Value Date/Time    LDH 254 (H) 10/13/2016 12:16 PM                 Assessment and Plan:  Problem   Fatigue   Anxiety and Depression   Melanoma (Hcc)    DIAGNOSIS: Melanoma  BRAF STATUS: POSITIVE  PAST THERAPY: S/P excisional biopsy of mid back lesion (03/19/2014) revealing malignant melanoma, superficial spreading, 5mm, with ulceration, mitotic index 5/mm2. Regression, microsatellitosis, perineural invasion, and tumor infiltrating lymphocytes were all present. Suspicious for lymphovascular invasion. He underwent WLE and bilateral SLN biopsy (05/04/2014). Negative for residual melanoma with 1/2 right axillary sentinel LNs positive for metastatic melanoma and 3/5 left axillary sentinel LNs positive. No extracapsular extension noted. MRI brain & PET (05/17/2014) were negative for distant metastatic disease. S/P bilateral completion axillary lymphadenectomies (06/30/2014). Pathology demonstrated metastatic melanoma in 3/10 right axillary nodes with multiple foci of melanoma involving the perinodal soft tissue. There was no evidence of melanoma in 11 left axillary nodes; however, there was metastatic melanoma noted involving the perinodal soft tissue on the left too. He completed radiation therapy to the bilateral axillae and back (08/2014). 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 S/P Keytruda x 3 cycles, stopped secondary to disease progression. He was then started on dabrafenib + trametinib (01/2015). The dabrafenib was dose-reduced to 75mg  BID secondary to neutropenia (02/26/2015), and then stopped secondary to fevers (03/04/2015)  PRESENT THERAPY: Dabrafenib 75mg  twice daily + trametinib 2mg  daily  Patient consented for the Melanoma Survivorship Registry 06/08/2016         Melanoma (HCC)  No progressive disease by history or exam  Will restage in 4 weeks    Anxiety and depression  Managing for the most part     Fatigue  Grade 1, stable       PLAN  -Continue on dabrafenib 75 mg twice daily in addition to 2 mg daily  -CT CAP, MRI head in 4 weeks  -He will need echo in 6-8 weeks   -RTC to see Dr. Merla Riches following staging scans, CBC, CMP, LDH prior to visit   -Encouraged close F/U in the interim as needed.  Da and his wife verbalized understanding and agrees with plan as outlined above      Twanna Hy, ARNP

## 2016-11-13 IMAGING — MR MRI Pelvis  w/o
5 of 13 series · 22 of 48 positions shown · non-contrast
Comparison: none

EXAM:  MRI OF THE PELVIS AND RIGHT HIP 
REASON FOR PROCEDURE:  A 66-year-old outpatient male with a history of prostate cancer and melena, who presents for evaluation of right hip pain for the past two months.  The patient denies any trauma.  The patient has clinical symptoms suggestive of AVN.

[Series 201: survey_sbc · axial · 10.0mm · 1.76mm/px · z∈[-1,+265]mm · 2 of 9 slices shown]
[im 1/9]
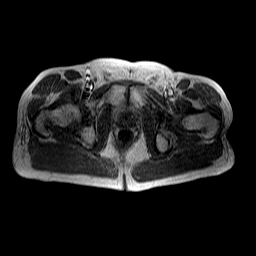
[im 9/9]
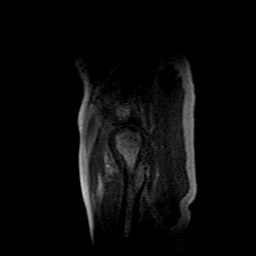

[Series 401: stir_longte sense · coronal · 5.0mm · 0.81mm/px · 7 of 43 slices shown]
[im 1/43]
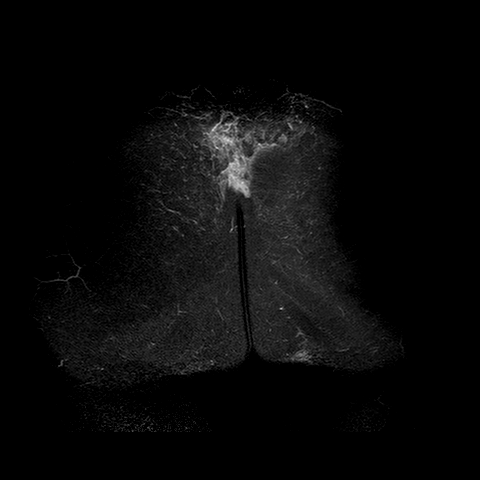
[im 8/43]
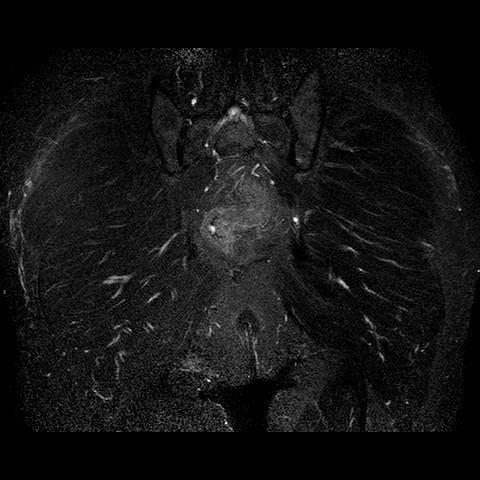
[im 15/43]
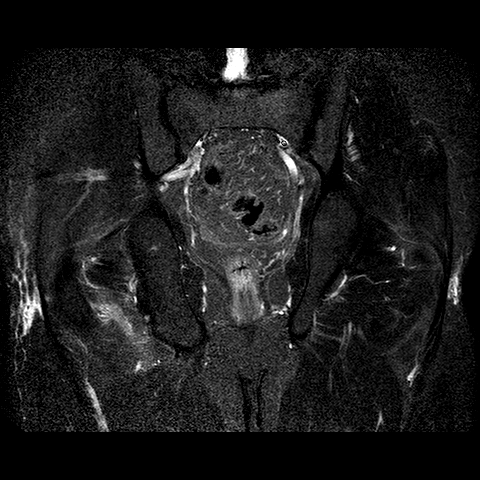
[im 22/43]
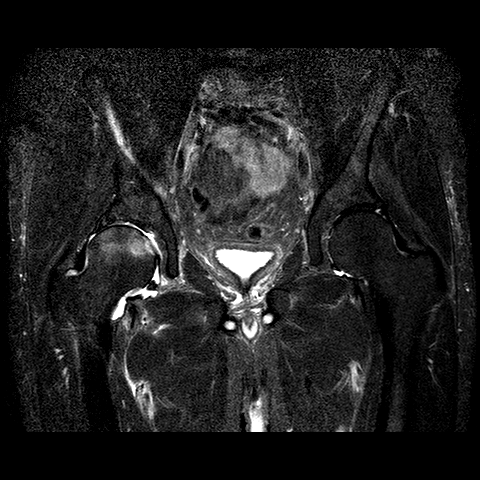
[im 29/43]
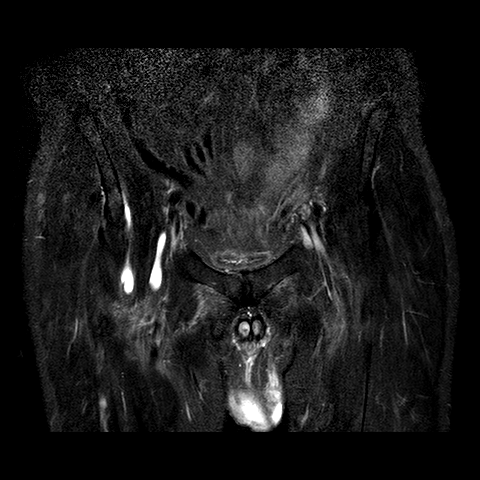
[im 36/43]
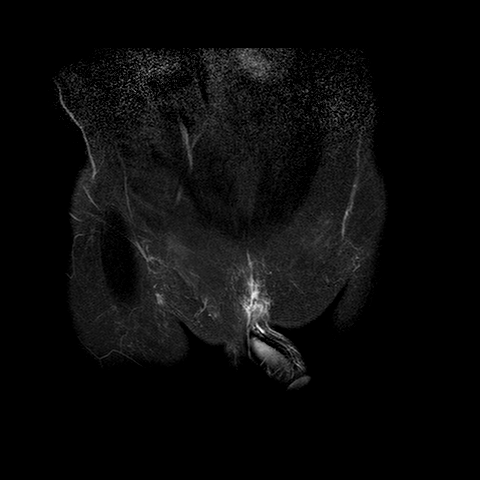
[im 43/43]
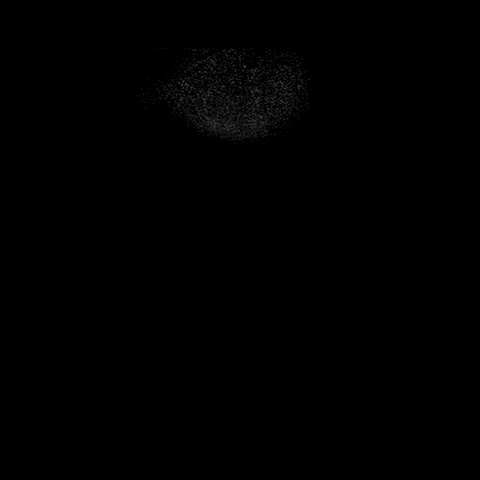

[Series 501: t1w_tse sense · coronal · 5.0mm · 0.81mm/px · 6 of 43 slices shown (1 of 2)]
[im 1/43]
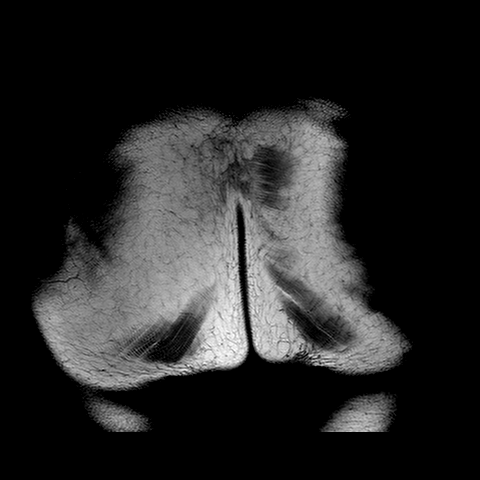
[im 9/43]
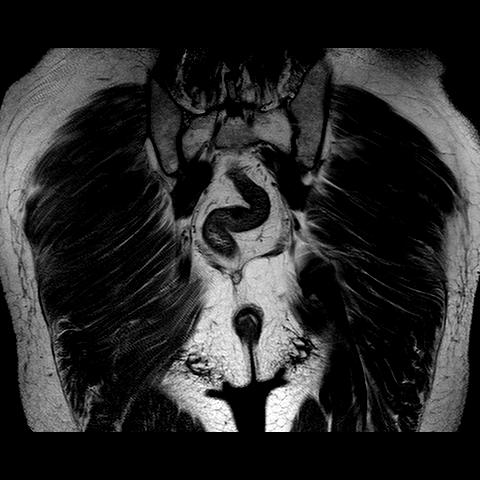
[im 17/43]
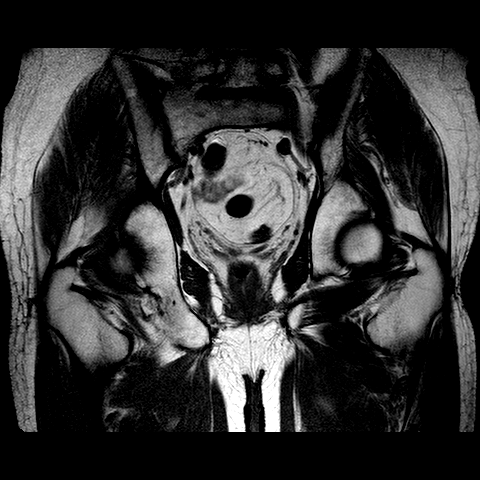
[im 26/43]
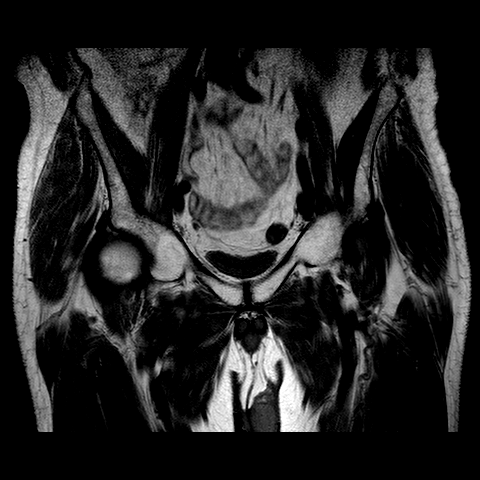
[im 34/43]
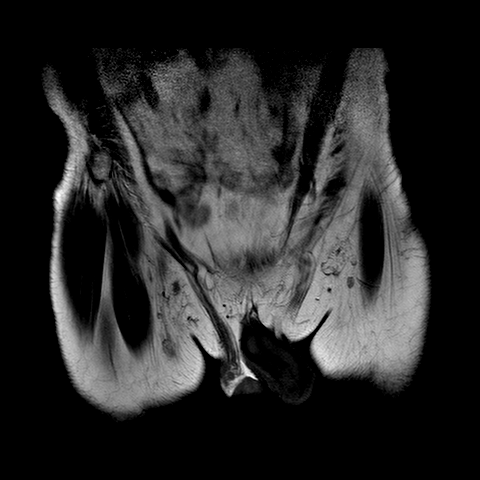
[im 43/43]
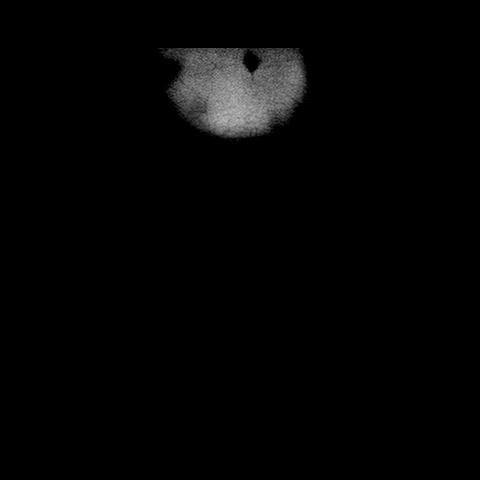

[Series 601: t1w_tse sense · axial · 5.0mm · 0.74mm/px · z∈[-63,+179]mm · 6 of 45 slices shown (2 of 2)]
[im 1/45]
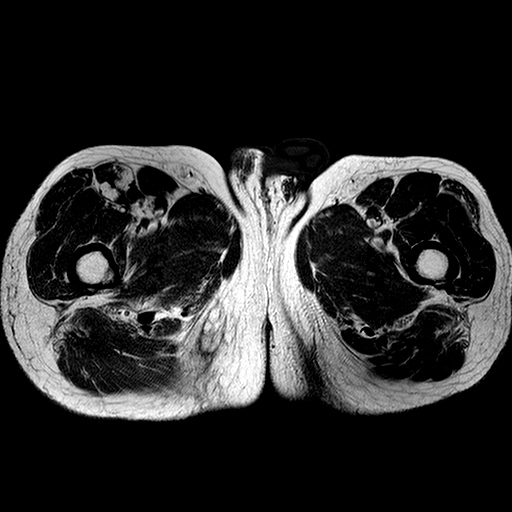
[im 9/45]
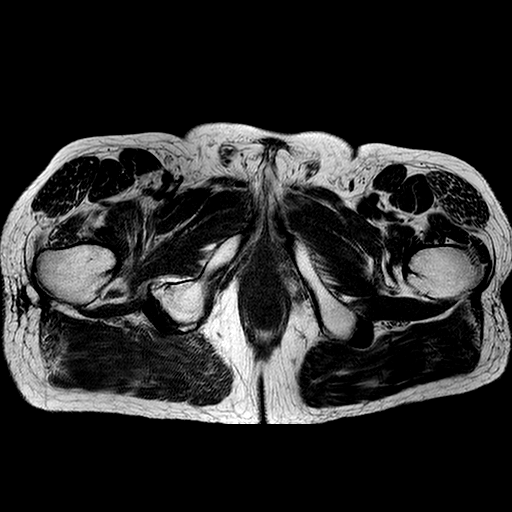
[im 18/45]
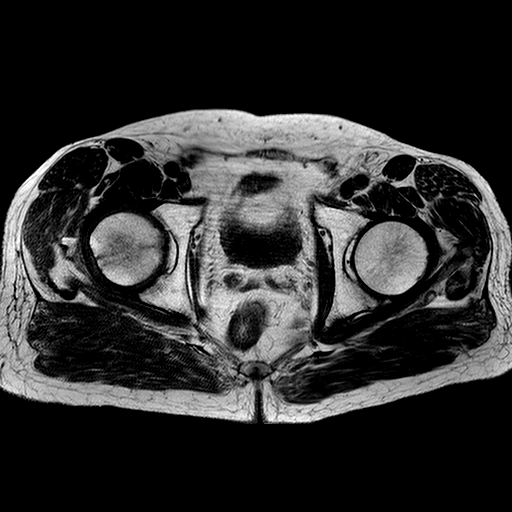
[im 27/45]
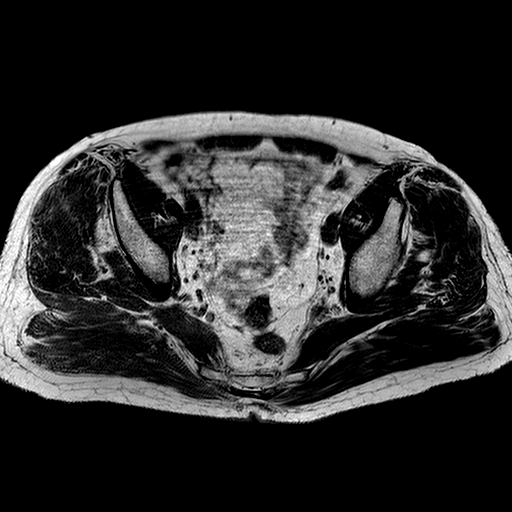
[im 36/45]
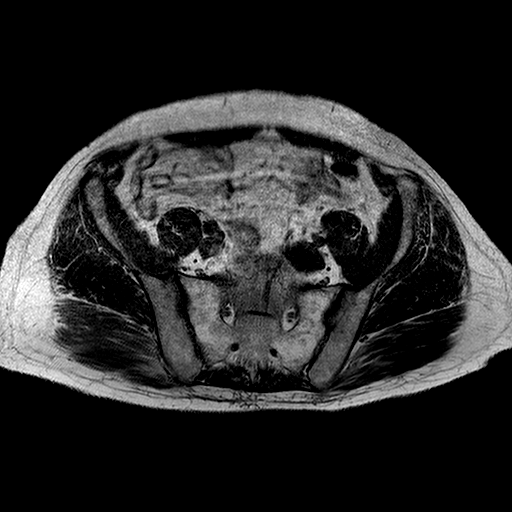
[im 45/45]
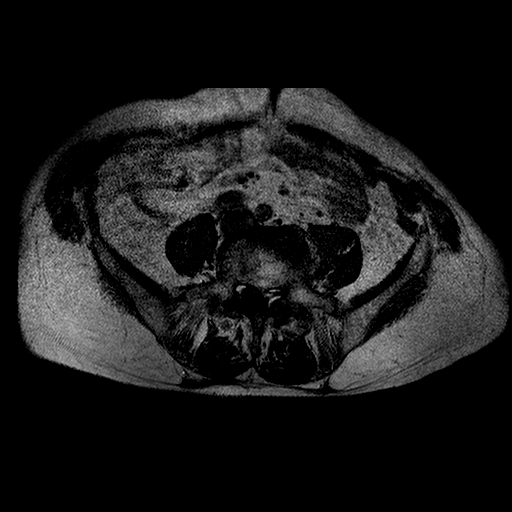

[Series 701: t2w_tse_spir sense · axial · 5.0mm · 0.74mm/px · 1 of 45 slices shown]
[im 1/45]
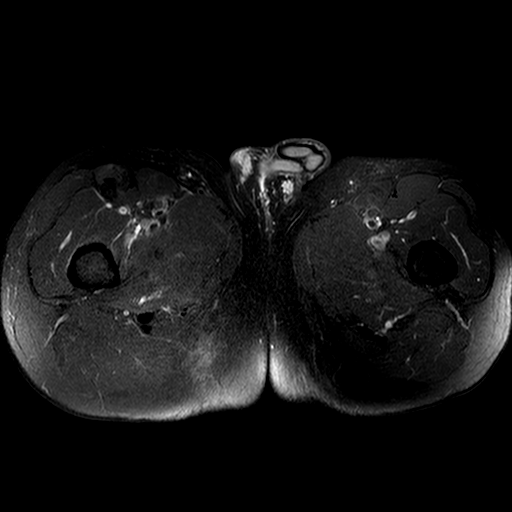

[22 of 48 positions shown; findings below may reference images not displayed]

FINDINGS: T1 weighted sequences, fatty suppressed T1, T2, and STIR sequences were obtained through the pelvis and right hip in multiple planes using the 1.5 Tesla magnet.  

There is abnormal signal noted in the patient's right femoral head.  
There is mild flattening of the femoral head on the T1 weighted sequences.  
There is decreased signal intensity on the T1 with increased signal on the T2 images highly suggestive of AVN.  
There are no signs to suggest an acute fracture of the right or left hips.  
No large joint effusion is seen involving the hips.  
There is mild marrow edema noted in the acetabulum.  
There are normal vascular flow voids in all vessels around the hip.  
There is soft tissue edema in the overlying muscles.  
There are degenerative changes noted in the spine.  
The pelvic viscera are grossly unremarkable.
IMPRESSION: Right hip avascular necrosis.  I would recommend clinical and laboratory correlation as to the etiology.  I would also recommend orthopedic management.

## 2016-11-19 ENCOUNTER — Ambulatory Visit: Admit: 2016-11-19 | Discharge: 2016-12-03 | Payer: MEDICARE

## 2016-11-19 DIAGNOSIS — C711 Malignant neoplasm of frontal lobe: Principal | ICD-10-CM

## 2016-12-03 ENCOUNTER — Ambulatory Visit: Admit: 2016-12-03 | Discharge: 2016-12-03 | Payer: MEDICARE

## 2016-12-03 ENCOUNTER — Encounter: Admit: 2016-12-03 | Discharge: 2016-12-03 | Payer: MEDICARE

## 2016-12-03 ENCOUNTER — Ambulatory Visit: Admit: 2016-12-03 | Discharge: 2016-12-04 | Payer: MEDICARE

## 2016-12-03 DIAGNOSIS — C711 Malignant neoplasm of frontal lobe: Principal | ICD-10-CM

## 2016-12-03 DIAGNOSIS — S129XXA Fracture of neck, unspecified, initial encounter: ICD-10-CM

## 2016-12-03 DIAGNOSIS — E785 Hyperlipidemia, unspecified: ICD-10-CM

## 2016-12-03 DIAGNOSIS — C7931 Secondary malignant neoplasm of brain: ICD-10-CM

## 2016-12-03 DIAGNOSIS — K219 Gastro-esophageal reflux disease without esophagitis: ICD-10-CM

## 2016-12-03 DIAGNOSIS — R202 Paresthesia of skin: ICD-10-CM

## 2016-12-03 DIAGNOSIS — F329 Major depressive disorder, single episode, unspecified: ICD-10-CM

## 2016-12-03 DIAGNOSIS — M1990 Unspecified osteoarthritis, unspecified site: ICD-10-CM

## 2016-12-03 DIAGNOSIS — H9190 Unspecified hearing loss, unspecified ear: ICD-10-CM

## 2016-12-03 DIAGNOSIS — F419 Anxiety disorder, unspecified: ICD-10-CM

## 2016-12-03 DIAGNOSIS — R002 Palpitations: ICD-10-CM

## 2016-12-03 DIAGNOSIS — C439 Malignant melanoma of skin, unspecified: Principal | ICD-10-CM

## 2016-12-03 DIAGNOSIS — C61 Malignant neoplasm of prostate: Principal | ICD-10-CM

## 2016-12-03 DIAGNOSIS — I10 Essential (primary) hypertension: ICD-10-CM

## 2016-12-03 DIAGNOSIS — E039 Hypothyroidism, unspecified: ICD-10-CM

## 2016-12-03 MED ORDER — IOPAMIDOL 76 % IV SOLN
100 mL | Freq: Once | INTRAVENOUS | 0 refills | Status: AC
Start: 2016-12-03 — End: ?

## 2016-12-03 MED ORDER — SODIUM CHLORIDE 0.9 % IJ SOLN
50 mL | Freq: Once | INTRAVENOUS | 0 refills | Status: CP
Start: 2016-12-03 — End: ?
  Administered 2016-12-03: 17:00:00 50 mL via INTRAVENOUS

## 2016-12-03 MED ORDER — IOHEXOL 350 MG IODINE/ML IV SOLN
100 mL | Freq: Once | INTRAVENOUS | 0 refills | Status: CP
Start: 2016-12-03 — End: ?
  Administered 2016-12-03: 17:00:00 100 mL via INTRAVENOUS

## 2016-12-03 MED ORDER — GADOBENATE DIMEGLUMINE 529 MG/ML (0.1MMOL/0.2ML) IV SOLN
20 mL | Freq: Once | INTRAVENOUS | 0 refills | Status: CP
Start: 2016-12-03 — End: ?
  Administered 2016-12-03: 18:00:00 20 mL via INTRAVENOUS

## 2016-12-03 NOTE — Progress Notes
Date:  12/03/2016       Gabriel Robertson is a 66 y.o. male.     The encounter diagnosis was Malignant neoplasm of frontal lobe (HCC).  Staging: Cancer Staging  Melanoma (HCC)  Staging form: Melanoma of the Skin, AJCC 7th Edition  - Clinical: No stage assigned - Unsigned  - Pathologic: Stage IIIC (T4b, N2b, cM0) - Signed by Sherril Croon, PA-C on 05/15/2014    Prior RT:  1. SRS to right frontal lobe metastasis, completed1/16/18  2. SRS to L frontotemporal lobe and right cerebellum, 20 Gy x 1, completed 05/18/16    3. Site of treatment: brain 4 lesions, left occipital lobe, right ant. Frontal lobe. Right sup. Frontal lobe and right post/ cerebellar  Date of treatment: 09/03/2016  Dose /fraction: 2000 cGy to each lesion, single fraction  Technique of radiation: SRS. 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 He continues on dabrafenib and tremetinib. He returns today for follow-up with MRI. He endorses overall weakness and some dizziness recently. He denies headaches, N/V, or unsteady gait. No acute vision changes. He denies seizures. No focal extremity weakness or numbness.    Review of Systems   Musculoskeletal: Positive for arthralgias, gait problem (uses walker), neck pain (chronic) and neck stiffness (chronic).   All other systems reviewed and are negative.        Objective:         ??? aspirin EC 81 mg tablet Take 81 mg by mouth daily.   ??? celecoxib (CELEBREX) 200 mg capsule    ??? dabrafenib (TAFINLAR) 75 mg capsule Take 75 mg in the am and 150 mg in the pm   ??? diazepam (VALIUM) 10 mg tablet Take 1 Tab by mouth every 6 hours as needed for Anxiety.   ??? gabapentin (NEURONTIN) 100 mg capsule Take 3 capsules by mouth three times daily.   ??? HYDROcodone/acetaminophen(+) (NORCO) 10/325 mg tablet    ??? levothyroxine (SYNTHROID) 150 mcg tablet Take 1 Tab by mouth daily 30 minutes before breakfast. ??? MULTIVITAMIN PO Take  by mouth daily.   ??? polyethylene glycol 3350 (MIRALAX) 17 g packet Take 17 g by mouth daily as needed.   ??? senna/docusate (SENOKOT-S) 8.6/50 mg tablet Take 1 Tab by mouth daily as needed (As needed for constipation). Indications: CONSTIPATION   ??? sertraline (ZOLOFT) 100 mg tablet Take 1 tablet by mouth daily.   ??? trametinib (MEKINIST) 2 mg tablet Take 1 tablet by mouth daily. Take at least 1 hour before or 2 hours after food.     Vitals:    12/03/16 1418   BP: 141/67   Pulse: 62   Resp: 16   Temp: 36.4 ???C (97.6 ???F)   SpO2: 99%   Weight: 103.9 kg (229 lb)   Height: 190.5 cm (75)     Body mass index is 28.62 kg/m???.     Pain Score: Six (joints)        KARNOFSKY PERFORMANCE SCORE:  90% Able to carry on normal activity; minor signs of disease     Physical Exam   Constitutional: He is oriented to person, place, and time. He appears well-developed and well-nourished. No distress.   Cardiovascular: Normal rate and regular rhythm.    Pulmonary/Chest: Effort normal and breath sounds normal.   Musculoskeletal: Normal range of motion.   Neurological: He is alert and  oriented to person, place, and time. He displays normal reflexes. No cranial nerve deficit. Coordination normal.          MRI of brain 12/03/2016  IMPRESSION    8 enhancing intracranial lesions compatible with metastases - none of   which are new. Some have increased in size and conspicuity. ???Several are   stable.    Assessment and Plan:  1. There are total 8 enhanced lesions in the brain, which were there on previous MRI. No new lesion developed  2. The 7 of these lesions were treated with SRS previously.   3. After carefully reviewed his [facility] records, we found the lesion on posterior falx was not treated.  4. Plan to treat posterior falx lesion with SRS.

## 2016-12-08 ENCOUNTER — Encounter: Admit: 2016-12-08 | Discharge: 2016-12-08 | Payer: MEDICARE

## 2016-12-08 DIAGNOSIS — C7931 Secondary malignant neoplasm of brain: ICD-10-CM

## 2016-12-08 DIAGNOSIS — M1990 Unspecified osteoarthritis, unspecified site: ICD-10-CM

## 2016-12-08 DIAGNOSIS — E785 Hyperlipidemia, unspecified: ICD-10-CM

## 2016-12-08 DIAGNOSIS — F419 Anxiety disorder, unspecified: ICD-10-CM

## 2016-12-08 DIAGNOSIS — C439 Malignant melanoma of skin, unspecified: ICD-10-CM

## 2016-12-08 DIAGNOSIS — C792 Secondary malignant neoplasm of skin: ICD-10-CM

## 2016-12-08 DIAGNOSIS — R002 Palpitations: ICD-10-CM

## 2016-12-08 DIAGNOSIS — S129XXA Fracture of neck, unspecified, initial encounter: ICD-10-CM

## 2016-12-08 DIAGNOSIS — R202 Paresthesia of skin: ICD-10-CM

## 2016-12-08 DIAGNOSIS — E039 Hypothyroidism, unspecified: ICD-10-CM

## 2016-12-08 DIAGNOSIS — H9190 Unspecified hearing loss, unspecified ear: ICD-10-CM

## 2016-12-08 DIAGNOSIS — R5383 Other fatigue: Secondary | ICD-10-CM

## 2016-12-08 DIAGNOSIS — M25551 Pain in right hip: ICD-10-CM

## 2016-12-08 DIAGNOSIS — C61 Malignant neoplasm of prostate: Principal | ICD-10-CM

## 2016-12-08 DIAGNOSIS — F329 Major depressive disorder, single episode, unspecified: ICD-10-CM

## 2016-12-08 DIAGNOSIS — I10 Essential (primary) hypertension: ICD-10-CM

## 2016-12-08 DIAGNOSIS — K219 Gastro-esophageal reflux disease without esophagitis: ICD-10-CM

## 2016-12-08 LAB — COMPREHENSIVE METABOLIC PANEL
Lab: 133 MMOL/L — ABNORMAL LOW (ref 137–147)
Lab: 3.9 MMOL/L (ref 3.5–5.1)

## 2016-12-08 LAB — CBC AND DIFF
Lab: 0.4 10*3/uL (ref 0–0.80)
Lab: 1 % (ref >60)
Lab: 1.1 10*3/uL (ref 1.0–4.8)
Lab: 11 % (ref 4–12)
Lab: 12 g/dL — ABNORMAL LOW (ref 13.5–16.5)
Lab: 14 % (ref 11–15)
Lab: 179 10*3/uL (ref 150–400)
Lab: 2 % (ref >60)
Lab: 2.1 10*3/uL (ref 1.8–7.0)
Lab: 3.6 K/UL — ABNORMAL LOW (ref 4.5–11.0)
Lab: 3.8 M/UL — ABNORMAL LOW (ref 4.4–5.5)
Lab: 30 % (ref 24–44)
Lab: 31 pg (ref 26–34)
Lab: 33 g/dL (ref 32.0–36.0)
Lab: 36 % — ABNORMAL LOW (ref 40–50)
Lab: 56 % (ref 41–77)
Lab: 7.1 FL (ref 7–11)
Lab: 93 FL (ref 80–100)

## 2016-12-08 NOTE — Assessment & Plan Note
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

## 2016-12-08 NOTE — Progress Notes
Name: Gabriel Robertson          MRN: [id number]      DOB: 05-Apr-1950      AGE: 66 y.o.   DATE OF SERVICE: 12/08/2016    Subjective:             Reason for Visit:  Heme/Onc Care      Gabriel Robertson is a 66 y.o. male.     Cancer Staging  Melanoma (HCC)  Staging form: Melanoma of the Skin, AJCC 7th Edition  - Clinical: No stage assigned - Unsigned  - Pathologic: Stage IIIC (T4b, N2b, cM0) - Signed by Sherril Croon, PA-C on 05/15/2014      History of Present Illness    [name] presents with his wife today for follow up of his metastatic melanoma.  He continues on dabrafenib + trametinib, dabrafenib at reduced dose of 75 mg twice daily and trametinib 2 mg daily.   He denies adverse symptoms from his targeted agents.    He continues to have right hip pain.  He recently had MRI hip done at [facility].  He was told he had AVN or right femoral head.  He had injection, this helped pain mildly.  He is now ambulating walker.    He denies signs or symptoms suggestive of infection.   He continues to have fatigue, this is unchanged.        Review of Systems   Constitutional: Positive for fatigue. Negative for appetite change, chills, fever and unexpected weight change.   HENT: Negative for hearing loss, sore throat and trouble swallowing.    Eyes: Negative for visual disturbance.   Respiratory: Negative for cough, choking, chest tightness and shortness of breath.    Cardiovascular: Negative for chest pain, palpitations and leg swelling.   Gastrointestinal: Positive for constipation. Negative for abdominal distention, abdominal pain, blood in stool, diarrhea, nausea and vomiting.   Genitourinary: Negative for dysuria and hematuria.   Musculoskeletal: Positive for arthralgias. Negative for back pain, myalgias and neck stiffness.        Right hip pain, persists    Skin: Negative for rash.   Neurological: Negative for dizziness, speech difficulty, weakness, light-headedness, numbness and headaches. Hematological: Does not bruise/bleed easily.   Psychiatric/Behavioral: Negative for confusion, dysphoric mood and sleep disturbance. The patient is not nervous/anxious.    All other systems reviewed and are negative.        Objective:         ??? aspirin EC 81 mg tablet Take 81 mg by mouth daily.   ??? celecoxib (CELEBREX) 200 mg capsule    ??? dabrafenib (TAFINLAR) 75 mg capsule Take 75 mg in the am and 150 mg in the pm   ??? diazepam (VALIUM) 10 mg tablet Take 1 Tab by mouth every 6 hours as needed for Anxiety.   ??? gabapentin (NEURONTIN) 100 mg capsule Take 3 capsules by mouth three times daily.   ??? HYDROcodone/acetaminophen(+) (NORCO) 10/325 mg tablet    ??? levothyroxine (SYNTHROID) 150 mcg tablet Take 1 Tab by mouth daily 30 minutes before breakfast.   ??? MULTIVITAMIN PO Take  by mouth daily.   ??? polyethylene glycol 3350 (MIRALAX) 17 g packet Take 17 g by mouth daily as needed.   ??? senna/docusate (SENOKOT-S) 8.6/50 mg tablet Take 1 Tab by mouth daily as needed (As needed for constipation). Indications: CONSTIPATION   ??? sertraline (ZOLOFT) 100 mg tablet Take 1 tablet by mouth daily.   ???  trametinib (MEKINIST) 2 mg tablet Take 1 tablet by mouth daily. Take at least 1 hour before or 2 hours after food.     Vitals:    12/08/16 1515   BP: 142/82   Pulse: 61   Temp: 36.7 ???C (98 ???F)   TempSrc: Oral   SpO2: 98%   Weight: 102.8 kg (226 lb 9.6 oz)   Height: 190.5 cm (75)     Body mass index is 28.32 kg/m???.     Pain Score: Seven  Pain Loc: Hip      Pain Addressed:  Current regimen working to control pain.     Patient Evaluated for a Clinical Trial: Patient not eligible for a treatment trial (including not needing treatment, needs palliative care, in remission).     [place] Cooperative Oncology Group performance status is 0, Fully active, able to carry on all pre-disease performance without restriction.Marland Kitchen     Physical Exam   Constitutional: He is oriented to person, place, and time. He appears well-developed and well-nourished. Ambulating with walker    HENT:   Head: Normocephalic and atraumatic.   Eyes: Pupils are equal, round, and reactive to light. EOM are normal.   Neck: Normal range of motion.   Cardiovascular: Normal rate, regular rhythm and normal heart sounds.    Pulmonary/Chest: Effort normal and breath sounds normal. No respiratory distress. He has no wheezes.   Abdominal: Soft. Bowel sounds are normal. There is no hepatosplenomegaly, splenomegaly or hepatomegaly.   Musculoskeletal: Normal range of motion. He exhibits no edema or tenderness.   Lymphadenopathy:        Head (right side): No submental and no submandibular adenopathy present.        Head (left side): No submental and no submandibular adenopathy present.     He has axillary adenopathy.        Left axillary: Pectoral adenopathy present.        Right: No inguinal and no supraclavicular adenopathy present.        Left: No inguinal and no supraclavicular adenopathy present.   Left axilla with pronounced fluid collection, unchanged   Neurological: He is alert and oriented to person, place, and time.   Skin: Skin is warm, dry and intact. No rash noted.   Black macular lesions to right chest that wraps around to right scapular region, unchanged from previous exam   Psychiatric: He has a normal mood and affect. His behavior is normal. Judgment and thought content normal. Cognition and memory are normal.   Vitals reviewed.       CBC w/Diff    Lab Results   Component Value Date/Time    WBC 3.6 (L) 12/08/2016 03:04 PM    RBC 3.83 (L) 12/08/2016 03:04 PM    HGB 12.2 (L) 12/08/2016 03:04 PM    HCT 36.0 (L) 12/08/2016 03:04 PM    MCV 93.8 12/08/2016 03:04 PM    MCH 31.7 12/08/2016 03:04 PM    MCHC 33.8 12/08/2016 03:04 PM    RDW 14.2 12/08/2016 03:04 PM    PLTCT 179 12/08/2016 03:04 PM    MPV 7.1 12/08/2016 03:04 PM    Lab Results   Component Value Date/Time    NEUT 56 12/08/2016 03:04 PM    ANC 2.10 12/08/2016 03:04 PM    LYMA 30 12/08/2016 03:04 PM ALC 1.10 12/08/2016 03:04 PM    MONA 11 12/08/2016 03:04 PM    AMC 0.40 12/08/2016 03:04 PM    EOSA 2 12/08/2016 03:04 PM  AEC 0.10 12/08/2016 03:04 PM    BASA 1 12/08/2016 03:04 PM    ABC 0.00 12/08/2016 03:04 PM          Comprehensive Metabolic Profile    Lab Results   Component Value Date/Time    NA 133 (L) 12/08/2016 03:04 PM    K 3.9 12/08/2016 03:04 PM    CL 101 12/08/2016 03:04 PM    CO2 28 12/08/2016 03:04 PM    GAP 4 12/08/2016 03:04 PM    BUN 19 12/08/2016 03:04 PM    CR 0.94 12/08/2016 03:04 PM    GLU 89 12/08/2016 03:04 PM    Lab Results   Component Value Date/Time    CA 9.2 12/08/2016 03:04 PM    PO4 2.7 02/15/2015 04:35 PM    ALBUMIN 4.0 12/08/2016 03:04 PM    TOTPROT 6.8 12/08/2016 03:04 PM    ALKPHOS 68 12/08/2016 03:04 PM    AST 31 12/08/2016 03:04 PM    ALT 18 12/08/2016 03:04 PM    TOTBILI 0.4 12/08/2016 03:04 PM    GFR >60 12/08/2016 03:04 PM    GFRAA >60 12/08/2016 03:04 PM          Other labs    No results found for: ESR Lab Results   Component Value Date/Time    LDH 254 (H) 10/13/2016 12:16 PM          Results for orders placed during the hospital encounter of 12/03/16   MRI HEAD WO/W CONTRAST    Narrative EXAM: MRI BRAIN     HISTORY:     Melanoma, brain metastasis, cutaneous metastases    TECHNIQUE: Multiplanar and multisequence MR imaging of the head was performed. This was done both before and after the administration of gadolinium contrast.    COMPARISON: MRI brain 08/20/2016     FINDINGS:    There are several enhancing lesions noted bilaterally several of which are larger, for example the left precentral lesion measures 0.5 cm (series 14 image 122), previously measuring 0.2 cm. Others are slightly more conspicuous or larger by 1 mm, for example the right cerebellar lesion measuring 0.4 cm (series 14 image 47).    No definite new lesion is identified. 8 lesions are seen in total, none of which are new. There is been increase in size and conspicuity of many lesions, however several are stable.    The ventricles and subarachnoid spaces are normal in size and configuration. There is no midline shift or mass effect. The vascular flow-voids are unremarkable. Diffusion weighted imaging is not indicative of acute or recent infarct.      Impression 8 enhancing intracranial lesions compatible with metastases - none of which are new. Some have increased in size and conspicuity.  Several are stable.       Approved by Denny Peon, M.D. on 12/03/2016 4:03 PM    By my electronic signature, I attest that I have personally reviewed the images for this examination and formulated the interpretations and opinions expressed in this report       Finalized by Marily Memos, M.D. on 12/03/2016 5:18 PM. Dictated by Denny Peon, M.D. on 12/03/2016 1:00 PM.       Results for orders placed during the hospital encounter of 12/03/16   CT CHEST W CONTRAST    Narrative CT CHEST, ABDOMEN AND PELVIS    Clinical Indication:  Male, 66 years old. Metastatic melanoma of the mid back.    Technique: Multiple contiguous axial images were obtained  through the chest, abdomen and pelvis following the administration of IV contrast material. Hepatic arterial, portal venous and delayed phases were obtained. Post processing coronal and sagittal reconstruction images were made from the axial images.      IV contrast: Omnipaque-350  Bowel contrast:  None    Comparison: CT CAP 09/07/2016 with multiple additional priors.    CHEST FINDINGS:    Lower Neck: Unremarkable    Axilla, Mediastinum and Hila: Prior bilateral axillary lymph node dissections. There is nodular thickening along the right axillary surgical clips. There is a persistent fluid collection in the left axilla, measuring 7.5 cm, previously 7 cm (series 6 image 20). There is no significant mediastinal or hilar lymphadenopathy.     Heart and Great Vessels: Heart size is normal without pericardial effusion. There is mild calcified coronary artery disease. 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    Airway, Lungs and Pleura: The central airways are patent. There is unchanged biapical pleural-parenchymal fibrosis with adjacent subpleural consolidation, likely related to prior radiation. No new or enlarging pulmonary nodule. There is no pleural effusion.    Chest Wall and Osseous Structures: There are healed right rib fractures with nonunion. There is thoracic spondylosis. No aggressive osseous lesions are noted.     ABDOMEN AND PELVIS FINDINGS:    Liver and Biliary system: The liver is enlarged, measuring 20.3 cm. There are innumerable small hypoenhancing lesions throughout the liver, not definitely changed when compared to November 2017. A reference segment 4B lesion measures 1.3 cm, previously 1.3 cm (series 7 image 26). The portal and hepatic veins are patent. The gallbladder is present without biliary ductal dilatation.     Spleen: The spleen is normal in size. Previously described splenic metastases are not well visualized on today's exam.    Adrenal Glands and Kidneys: Unremarkable.    Pancreas and Retroperitoneum: The pancreas is unremarkable. There is no retroperitoneal lymphadenopathy.    Aorta and Major Vessels: There is mild calcific atherosclerosis of the abdominal aorta and iliac arteries, which are normal in caliber. There is a circumaortic left renal vein.     Bowel, Mesentery and Peritoneal space: The small and large bowel loops are normal in caliber. There is no ascites or mesenteric adenopathy.    Pelvis: The bladder is decompressed. The prostate gland is not visualized. There is no pelvic lymphadenopathy.    Abdominal wall and Osseous Structures: There is lumbar spondylosis with mild stepwise retrolisthesis from L1 to L4. No aggressive osseous lesions are noted.      Impression CHEST:  1.  No thoracic metastatic disease. 2.  Prior bilateral axillary lymph node dissections with persistent left axillary postoperative seroma.    ABDOMEN AND PELVIS:  1.  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    2.  No abdominal or pelvic lymphadenopathy.    3.  Nonvisualization of previously described splenic metastases.       Finalized by Lavon Paganini, M.D. on 12/03/2016 2:08 PM. Dictated by Lavon Paganini, M.D. on 12/03/2016 1:27 PM.       No results found for this or any previous visit.           Assessment and Plan:  Problem   Right Hip Pain   Melanoma (Hcc)    DIAGNOSIS: Melanoma  BRAF STATUS: POSITIVE  PAST THERAPY: S/P excisional biopsy of mid back lesion (03/19/2014) revealing malignant melanoma, superficial spreading, 5mm, with ulceration, mitotic index 5/mm2. Regression, microsatellitosis, perineural invasion,  and tumor infiltrating lymphocytes were all present. Suspicious for lymphovascular invasion. He underwent WLE and bilateral SLN biopsy (05/04/2014). Negative for residual melanoma with 1/2 right axillary sentinel LNs positive for metastatic melanoma and 3/5 left axillary sentinel LNs positive. No extracapsular extension noted. MRI brain & PET (05/17/2014) were negative for distant metastatic disease. S/P bilateral completion axillary lymphadenectomies (06/30/2014). Pathology demonstrated metastatic melanoma in 3/10 right axillary nodes with multiple foci of melanoma involving the perinodal soft tissue. There was no evidence of melanoma in 11 left axillary nodes; however, there was metastatic melanoma noted involving the perinodal soft tissue on the left too. He completed radiation therapy to the bilateral axillae and back (08/2014). 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 S/P Keytruda x 3 cycles, stopped secondary to disease progression. He was then started on dabrafenib + trametinib (01/2015). 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  I have reviewed the history, physical, impression and plan outlined by the Nurse Practitioner.    The patient presents for follow up of malignant melanoma.  There are no new complaints.  There are no signs or symptoms consistent with infection or bleeding.  Energy is baseline.  No pain.      On examination there are no palpable masses or lymphadenopathy.  Chest:  No rales, rhonchi or wheezes; CV :  RRR without murmur or gallop;  Abdomen:  Soft, without masses megaly or tenderness.  Neuro: intact.  Skin lesions: extensive anterior chest/ posterior chest on the right.   My impression is malignant melanoma, no evidence of progressive disease.     My plan is to continue BRAF/ MEK combination.  We will need to fill out paperwork for assistance.     PLAN  -Continue on dabrafenib 75 mg twice daily in addition to 2 mg daily  -ECHO with return visit  -Will work to get MRI right hip.  He is to continue follow up with his orthopedist  -Continue close F/U with Dr. Regino Schultze with radiation/oncology  -RTC in 4 weeks with labs prior to visit.  Plan to restage in 12 weeks   -Encouraged close F/U in the interim as needed.  Adren and his wife verbalized understanding and agrees with plan as outlined above      Twanna Hy, ARNP     My supervising physician for this visit is Dr. Adora Fridge.     Total time: 25 minutes, with > 50% time spent counseling with regard to disease status, treatment options, necessity for further testing, and outlining a follow up plan.  This conversation was had with the patient, and his wife.

## 2016-12-08 NOTE — Assessment & Plan Note
No progressive disease by history, exam, or staging scans

## 2016-12-19 ENCOUNTER — Ambulatory Visit: Admit: 2016-12-19 | Discharge: 2017-01-02 | Payer: MEDICARE

## 2016-12-19 DIAGNOSIS — C439 Malignant melanoma of skin, unspecified: Principal | ICD-10-CM

## 2016-12-22 ENCOUNTER — Encounter: Admit: 2016-12-22 | Discharge: 2016-12-22 | Payer: MEDICARE

## 2016-12-22 ENCOUNTER — Ambulatory Visit: Admit: 2016-12-22 | Discharge: 2016-12-22 | Payer: MEDICARE

## 2016-12-22 NOTE — Progress Notes
Left voicemail to obtain income for 2019 enrollment to the drug company for continued free  [place] and [place].    Britt patient Advocate

## 2016-12-23 ENCOUNTER — Encounter: Admit: 2016-12-23 | Discharge: 2016-12-23 | Payer: MEDICARE

## 2016-12-27 ENCOUNTER — Encounter: Admit: 2016-12-27 | Discharge: 2016-12-27 | Payer: MEDICARE

## 2016-12-29 ENCOUNTER — Ambulatory Visit: Admit: 2016-12-29 | Discharge: 2016-12-29 | Payer: MEDICARE

## 2016-12-29 ENCOUNTER — Encounter: Admit: 2016-12-29 | Discharge: 2016-12-29 | Payer: MEDICARE

## 2016-12-29 DIAGNOSIS — C713 Malignant neoplasm of parietal lobe: Secondary | ICD-10-CM

## 2016-12-29 MED ORDER — LORAZEPAM 1 MG PO TAB
1 mg | Freq: Once | ORAL | 0 refills | Status: CP
Start: 2016-12-29 — End: ?
  Administered 2016-12-29: 21:00:00 1 mg via ORAL

## 2016-12-29 NOTE — Procedures
Patient: Gabriel Robertson    Medical record number: [id number]    Procedure: MRI and CT directed stereotactic radiosurgical treatment to a right parietal metastatic brain tumor utilizing the stereotactic mask fixation system    Date of procedure: December 29, 2016    Surgeon: Artis Delay, MD    Assistant Surgeon: Janyce Llanos, MD    Anesthesia: Oral anxiolytic    Clinical summary: [name] is a very nice 66 y.o. male with a history of metastatic malignant melanoma.   The risks and possible complications of this procedure have been carefully reviewed.  These include failure to control the growth of the tumor, recurrence of the tumor and the delayed risks of radiation necrosis.  He has also had extensive consultation with Dr. Regino Schultze of the Radiation Oncology department concerning the risks and possible complications of this procedure. He expresses understanding and wishes to proceed.     Description: The preplanning MRI examination of the brain was imported into the computer in the [place] planning center.  The patient was fit with a stereotactic mask and subsequently underwent a noncontrast CT examination of the head.  These images were imported and fused with the preplanning MRI examination of the brain.  The area to be treated was targeted and the treatment plan was developed.  Based upon the volume and location of the tumor, Dr. Regino Schultze recommended a dose of 24 Gy.  After performing the final checks, the treatment plan was approved by the staff medical physicist, by Dr. Regino Schultze and by me.  The plan was then imported into the computer in the [place] treatment center.  On the day of treatment, the patient was pretreated with oral anxiolytic medication.  He reclined on the treatment table and all pressure points were carefully padded. His head was immobilized using the stereotactic mask fixation system.  Patient positioning was verified with ExacTrac and cone beam CT.  After performing a final visual check, the treatment was delivered.  The plan called for the delivery of radiation utilizing 4 arcs.    Immediately upon completion of the treatment, the stereotactic mask was removed.  The patient had no new neurologic deficit as a result of the treatment today and there were no complications.  At the completion of the treatment, he was escorted back to the holding area and was given appropriate discharge and follow-up instructions.  I was present throughout the course the treatment today.

## 2016-12-29 NOTE — Progress Notes
End of Treatment Summary Note    Date:  12/29/2016    Gabriel Robertson is a 66 y.o. male.     There were no encounter diagnoses.  Staging: Cancer Staging  Melanoma ([place])  Staging form: Melanoma of the Skin, AJCC 7th Edition  - Clinical: No stage assigned - Unsigned  - Pathologic: Stage IIIC (T4b, N2b, cM0) - Signed by Sandrea Hughs, PA-C on 05/15/2014      Treatment Data Summary:    Treatment Data 09/03/2016 09/03/2016 09/03/2016 09/03/2016 09/03/2016 12/29/2016 12/29/2016   Course ID - - - - - [id number] -   Plan ID [facility] - - - - PTV post falx -   Prescription Dose (cGy) 2,000 - - - - 2,400 -   Prescribed Dose per Fraction (Gy) 20 - - - - 24 -   Fractions Treated to Date 1 - - - - 1 -   Total Fractions on Plan 1 - - - - 1 -   Treatment Elapsed Days - - - - - 0 -   Reference Point ID Beam Dose Point4 right sup, front Beam Dose Point5 left occipitalPT Beam Dose Point6 post falx PTV Beam Dose Point7   Dosage Given to Date 20 19.99999999 [id number] 20 20 24 24    Some recent data might be hidden        Site of treatment: brain post falx  Date of treatment: 12/29/2016  Dose /fraction: 2400 cGy, single fraction  Technique of radiation: SRS, 6 x photon     Treatment Tolerance and Response:  The patient tolerated treatment well without unexpected or significant acute side effects.      Follow Up:  Cordel Drewes will be scheduled for a follow up with [facility] in three months.

## 2017-01-02 DIAGNOSIS — C713 Malignant neoplasm of parietal lobe: ICD-10-CM

## 2017-01-02 DIAGNOSIS — C7931 Secondary malignant neoplasm of brain: ICD-10-CM

## 2017-01-02 DIAGNOSIS — C439 Malignant melanoma of skin, unspecified: Principal | ICD-10-CM

## 2017-01-03 ENCOUNTER — Ambulatory Visit: Admit: 2017-01-03 | Discharge: 2017-01-18 | Payer: MEDICARE

## 2017-01-04 ENCOUNTER — Encounter: Admit: 2017-01-04 | Discharge: 2017-01-04 | Payer: MEDICARE

## 2017-01-04 DIAGNOSIS — Z79899 Other long term (current) drug therapy: Principal | ICD-10-CM

## 2017-01-04 DIAGNOSIS — C439 Malignant melanoma of skin, unspecified: ICD-10-CM

## 2017-01-04 MED ORDER — TRAMETINIB 2 MG PO TAB
2 mg | ORAL_TABLET | Freq: Every day | ORAL | 3 refills | Status: CN
Start: 2017-01-04 — End: ?

## 2017-01-04 MED ORDER — DABRAFENIB 75 MG PO CAP
75 mg | ORAL_CAPSULE | Freq: Two times a day (BID) | ORAL | 3 refills | Status: CN
Start: 2017-01-04 — End: ?

## 2017-01-04 MED ORDER — DABRAFENIB 75 MG PO CAP
75 mg | ORAL_CAPSULE | Freq: Two times a day (BID) | ORAL | 3 refills | Status: AC
Start: 2017-01-04 — End: 2017-01-08

## 2017-01-04 MED ORDER — TRAMETINIB 2 MG PO TAB
2 mg | ORAL_TABLET | Freq: Every day | ORAL | 3 refills | Status: AC
Start: 2017-01-04 — End: 2017-01-08

## 2017-01-04 MED ORDER — TRAMETINIB 2 MG PO TAB
2 mg | ORAL_TABLET | Freq: Every day | ORAL | 3 refills | Status: DC
Start: 2017-01-04 — End: 2017-01-04

## 2017-01-04 MED ORDER — DABRAFENIB 75 MG PO CAP
75 mg | ORAL_CAPSULE | Freq: Two times a day (BID) | ORAL | 3 refills | Status: DC
Start: 2017-01-04 — End: 2017-01-04

## 2017-01-04 NOTE — Telephone Encounter
Error.

## 2017-01-04 NOTE — Oral Chemotherapy Note
Name:Gabriel Robertson           MRN: [id number]                 DOB:Mar 09, 1950          Age: 66 y.o.  Date of Service: 01/04/17      Oral Chemotherapy Refill Review     Patient Information:     Correct patient/DOB/Diagnosis: Yes    Reviewed most recent clinic notes for changes in plan: Yes    Most recent labs reviewed: Yes    Appointment Information:   Date of last appointment: 12/08/2016    Follow up appointment scheduled : Yes 01/21/2017      Medication Information:     Medication name: Dabrafenib and Trametinib    Medication in [place]: Yes     Date of last refill released from [facility]: 08/19/15    Weight change >88%: Not applicable    Has a dose adjustment been made since last refill?No    Is Pharmacy listed in [place] treatment plan? Yes    Pharmacy: [facility]-receives assistance through [employer]    Consent Date 01/04/17-Phone Consent    Patient Contact  Method of communication: telephone  Sydnee Cabal RN confirmed need for refill: Yes  Patient taking medication as directed Yes  Confirmation of receiving pharmacy: Yes  Patient is anticipating a start date of: 01/06/17    Other Information:     Plan was routed to Dr. Laney Pastor and Thedora Hinders APRN

## 2017-01-04 NOTE — Progress Notes
I had a follow-up discussion with the patient regarding their diagnosis of melanoma and his or her recommended treatment via telephone.  I have recommended treatment with the following chemotherapy/biotherapy drugs: dabrafenib and trametinib. The goals and duration of treatment have been discussed in detail.  I explained alternative ways of treating the condition, including no treatment, and the risks and benefits of the alternatives. I offered the patient a chance to ask questions.  Any questions asked have been answered. Evelena Peat witnessed the telephone conversation.  The patient has agreed to the recommended treatment.  The patient also understands they will need to sign a form documenting our conversation and his or her agreement the next time they are in clinic.

## 2017-01-05 ENCOUNTER — Encounter: Admit: 2017-01-05 | Discharge: 2017-01-05 | Payer: MEDICARE

## 2017-01-05 NOTE — Progress Notes
Submitted application for 5621 enrollment to the drug company for continued free Mekinist and [place] [facility]

## 2017-01-06 ENCOUNTER — Encounter: Admit: 2017-01-06 | Discharge: 2017-01-06 | Payer: MEDICARE

## 2017-01-07 ENCOUNTER — Encounter: Admit: 2017-01-07 | Discharge: 2017-01-07 | Payer: MEDICARE

## 2017-01-07 NOTE — Progress Notes
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

## 2017-01-08 ENCOUNTER — Encounter: Admit: 2017-01-08 | Discharge: 2017-01-08 | Payer: MEDICARE

## 2017-01-08 DIAGNOSIS — Z79899 Other long term (current) drug therapy: Principal | ICD-10-CM

## 2017-01-08 DIAGNOSIS — C439 Malignant melanoma of skin, unspecified: ICD-10-CM

## 2017-01-08 MED ORDER — TRAMETINIB 2 MG PO TAB
2 mg | ORAL_TABLET | Freq: Every day | ORAL | 3 refills | Status: AC
Start: 2017-01-08 — End: 2017-01-25

## 2017-01-08 MED ORDER — TRAMETINIB 2 MG PO TAB
2 mg | ORAL_TABLET | Freq: Every day | ORAL | 3 refills | Status: AC
Start: 2017-01-08 — End: 2017-01-08

## 2017-01-08 MED ORDER — DABRAFENIB 75 MG PO CAP
75 mg | ORAL_CAPSULE | Freq: Two times a day (BID) | ORAL | 3 refills | Status: AC
Start: 2017-01-08 — End: 2017-01-08

## 2017-01-08 MED ORDER — DABRAFENIB 75 MG PO CAP
75 mg | ORAL_CAPSULE | Freq: Two times a day (BID) | ORAL | 3 refills | Status: AC
Start: 2017-01-08 — End: 2017-01-25

## 2017-01-08 NOTE — Progress Notes
Patient called upset that he hasn't gotten the refill of his [place] and Mekinist. The script was faxed to [employer] on 12/17 at approximately 1pm. We are told that it can take 2-3 business days to receive and process but they do no show that they've received a fax yet. I've notified the [place] of this and that if a verbal is sent, then it would take 24-48 hours. At this time the patient is obtaining some samples to help tide him over until [employer] can process the script and send to him. I called him at 11:20am today to update him but got his voicemail. I left a message for him to call me back.

## 2017-01-08 NOTE — Progress Notes
[place] today to confirm receipt of Dabrafenib Trametinib scripts sent on 01/08/2017 by [facility], however, rep told me that they need 24 hours to even be able to see the electronic scripts showing up in their system, and since they're not open on 12/24 or 12/25, will likely start to process it on 12/26 at the earliest. Will call them back then to confirm receipt of script.     I did however confirm that they are contracted with [employer] and therefore future scripts can be escribed to them instead of print/fax to the drug company.     Willeen Cass, PHARMD

## 2017-01-13 ENCOUNTER — Encounter: Admit: 2017-01-13 | Discharge: 2017-01-13 | Payer: MEDICARE

## 2017-01-13 NOTE — Progress Notes
[employer] verified that they received the escribed Mekinist and Tafinlar from last Friday. The pharmacy will process the ASAP and reach out to the patient to schedule the fill.

## 2017-01-13 NOTE — Progress Notes
I called Gabriel Robertson to let him know the Drug company has received his prescription for Mekinist and Taflinar. He did not answer and his mailbox is full. The Drug company will be trying to reach out to him to schedule a delivery.       [place] patient [facility]

## 2017-01-18 DIAGNOSIS — C7931 Secondary malignant neoplasm of brain: ICD-10-CM

## 2017-01-18 DIAGNOSIS — C439 Malignant melanoma of skin, unspecified: Principal | ICD-10-CM

## 2017-01-21 ENCOUNTER — Encounter: Admit: 2017-01-21 | Discharge: 2017-01-21 | Payer: MEDICARE

## 2017-01-21 ENCOUNTER — Ambulatory Visit: Admit: 2017-01-21 | Discharge: 2017-01-22 | Payer: MEDICARE

## 2017-01-21 DIAGNOSIS — Z79899 Other long term (current) drug therapy: ICD-10-CM

## 2017-01-21 DIAGNOSIS — F329 Major depressive disorder, single episode, unspecified: ICD-10-CM

## 2017-01-21 DIAGNOSIS — R5383 Other fatigue: ICD-10-CM

## 2017-01-21 DIAGNOSIS — R002 Palpitations: ICD-10-CM

## 2017-01-21 DIAGNOSIS — K219 Gastro-esophageal reflux disease without esophagitis: ICD-10-CM

## 2017-01-21 DIAGNOSIS — C773 Secondary and unspecified malignant neoplasm of axilla and upper limb lymph nodes: ICD-10-CM

## 2017-01-21 DIAGNOSIS — F419 Anxiety disorder, unspecified: ICD-10-CM

## 2017-01-21 DIAGNOSIS — I10 Essential (primary) hypertension: ICD-10-CM

## 2017-01-21 DIAGNOSIS — E039 Hypothyroidism, unspecified: ICD-10-CM

## 2017-01-21 DIAGNOSIS — C439 Malignant melanoma of skin, unspecified: Principal | ICD-10-CM

## 2017-01-21 DIAGNOSIS — H9190 Unspecified hearing loss, unspecified ear: ICD-10-CM

## 2017-01-21 DIAGNOSIS — M1990 Unspecified osteoarthritis, unspecified site: ICD-10-CM

## 2017-01-21 DIAGNOSIS — S129XXA Fracture of neck, unspecified, initial encounter: ICD-10-CM

## 2017-01-21 DIAGNOSIS — C61 Malignant neoplasm of prostate: Principal | ICD-10-CM

## 2017-01-21 DIAGNOSIS — Z923 Personal history of irradiation: ICD-10-CM

## 2017-01-21 DIAGNOSIS — E785 Hyperlipidemia, unspecified: ICD-10-CM

## 2017-01-21 DIAGNOSIS — R202 Paresthesia of skin: ICD-10-CM

## 2017-01-21 LAB — COMPREHENSIVE METABOLIC PANEL
Lab: 0.9 mg/dL (ref 0.4–1.24)
Lab: 102 mg/dL — ABNORMAL HIGH (ref 70–100)
Lab: 135 MMOL/L — ABNORMAL LOW (ref 137–147)
Lab: 16 mg/dL — ABNORMAL LOW (ref 7–25)
Lab: 3.8 MMOL/L — ABNORMAL LOW (ref 3.5–5.1)
Lab: 9.4 mg/dL — ABNORMAL LOW (ref 8.5–10.6)

## 2017-01-21 LAB — THYROID STIMULATING HORMONE-TSH: Lab: 41 uU/mL — ABNORMAL HIGH (ref 0.35–5.00)

## 2017-01-21 LAB — CBC AND DIFF
Lab: 3.5 10*3/uL — ABNORMAL LOW (ref 4.5–11.0)
Lab: 4 M/UL — ABNORMAL LOW (ref 4.4–5.5)

## 2017-01-21 MED ORDER — PERFLUTREN LIPID MICROSPHERES 1.1 MG/ML IV SUSP
1-20 mL | Freq: Once | INTRAVENOUS | 0 refills | Status: CP
Start: 2017-01-21 — End: ?
  Administered 2017-01-21: 19:00:00 1.5 mL via INTRAVENOUS

## 2017-01-21 MED ORDER — MORPHINE 15 MG PO TBER
15 mg | ORAL_TABLET | Freq: Two times a day (BID) | ORAL | 0 refills | 7.00000 days | Status: AC
Start: 2017-01-21 — End: 2017-07-02

## 2017-01-22 ENCOUNTER — Encounter: Admit: 2017-01-22 | Discharge: 2017-01-22 | Payer: MEDICARE

## 2017-01-25 ENCOUNTER — Encounter: Admit: 2017-01-25 | Discharge: 2017-01-25 | Payer: MEDICARE

## 2017-01-25 MED ORDER — TRAMETINIB 2 MG PO TAB
2 mg | ORAL_TABLET | Freq: Every day | ORAL | 3 refills | Status: AC
Start: 2017-01-25 — End: 2017-07-02
  Filled 2017-02-10 (×2): qty 30, 30d supply, fill #1

## 2017-01-25 MED ORDER — DABRAFENIB 75 MG PO CAP
75 mg | ORAL_CAPSULE | Freq: Two times a day (BID) | ORAL | 3 refills | Status: CN
Start: 2017-01-25 — End: ?

## 2017-01-25 MED ORDER — TRAMETINIB 2 MG PO TAB
2 mg | ORAL_TABLET | Freq: Every day | ORAL | 3 refills | Status: CN
Start: 2017-01-25 — End: ?

## 2017-01-25 MED ORDER — DABRAFENIB 75 MG PO CAP
75 mg | ORAL_CAPSULE | Freq: Two times a day (BID) | ORAL | 3 refills | Status: AC
Start: 2017-01-25 — End: 2017-07-02
  Filled 2017-02-10 (×2): qty 60, 30d supply, fill #1

## 2017-01-28 ENCOUNTER — Encounter: Admit: 2017-01-28 | Discharge: 2017-01-28 | Payer: MEDICARE

## 2017-02-02 ENCOUNTER — Encounter: Admit: 2017-02-02 | Discharge: 2017-02-02 | Payer: MEDICARE

## 2017-02-10 ENCOUNTER — Encounter: Admit: 2017-02-10 | Discharge: 2017-02-10 | Payer: MEDICARE

## 2017-03-10 ENCOUNTER — Encounter: Admit: 2017-03-10 | Discharge: 2017-03-10 | Payer: MEDICARE

## 2017-03-10 DIAGNOSIS — C439 Malignant melanoma of skin, unspecified: Principal | ICD-10-CM

## 2017-03-12 ENCOUNTER — Encounter: Admit: 2017-03-12 | Discharge: 2017-03-12 | Payer: MEDICARE

## 2017-03-12 MED FILL — DABRAFENIB 75 MG PO CAP: 75 mg | ORAL | 30 days supply | Qty: 60 | Fill #2 | Status: AC

## 2017-03-15 ENCOUNTER — Encounter: Admit: 2017-03-15 | Discharge: 2017-03-15 | Payer: MEDICARE

## 2017-03-15 ENCOUNTER — Encounter: Admit: 2017-03-15 | Discharge: 2017-03-16 | Payer: MEDICARE

## 2017-03-15 DIAGNOSIS — I700 Atherosclerosis of aorta: ICD-10-CM

## 2017-03-15 DIAGNOSIS — C439 Malignant melanoma of skin, unspecified: ICD-10-CM

## 2017-03-15 DIAGNOSIS — M4319 Spondylolisthesis, multiple sites in spine: ICD-10-CM

## 2017-03-15 DIAGNOSIS — M47815 Spondylosis without myelopathy or radiculopathy, thoracolumbar region: ICD-10-CM

## 2017-03-15 DIAGNOSIS — K219 Gastro-esophageal reflux disease without esophagitis: ICD-10-CM

## 2017-03-15 DIAGNOSIS — R160 Hepatomegaly, not elsewhere classified: ICD-10-CM

## 2017-03-15 DIAGNOSIS — F329 Major depressive disorder, single episode, unspecified: ICD-10-CM

## 2017-03-15 DIAGNOSIS — C61 Malignant neoplasm of prostate: Principal | ICD-10-CM

## 2017-03-15 DIAGNOSIS — E039 Hypothyroidism, unspecified: ICD-10-CM

## 2017-03-15 DIAGNOSIS — F419 Anxiety disorder, unspecified: ICD-10-CM

## 2017-03-15 DIAGNOSIS — R202 Paresthesia of skin: ICD-10-CM

## 2017-03-15 DIAGNOSIS — C787 Secondary malignant neoplasm of liver and intrahepatic bile duct: ICD-10-CM

## 2017-03-15 DIAGNOSIS — R002 Palpitations: ICD-10-CM

## 2017-03-15 DIAGNOSIS — M1990 Unspecified osteoarthritis, unspecified site: ICD-10-CM

## 2017-03-15 DIAGNOSIS — H9190 Unspecified hearing loss, unspecified ear: ICD-10-CM

## 2017-03-15 DIAGNOSIS — I10 Essential (primary) hypertension: ICD-10-CM

## 2017-03-15 DIAGNOSIS — S129XXA Fracture of neck, unspecified, initial encounter: ICD-10-CM

## 2017-03-15 DIAGNOSIS — E785 Hyperlipidemia, unspecified: ICD-10-CM

## 2017-03-15 LAB — POC CREATININE, RAD: Lab: 0.9 mg/dL (ref 0.4–1.24)

## 2017-03-15 LAB — COMPREHENSIVE METABOLIC PANEL
Lab: 0.3 mg/dL (ref 0.3–1.2)
Lab: 0.9 mg/dL (ref >60)
Lab: 102 MMOL/L (ref 98–110)
Lab: 133 MMOL/L — ABNORMAL LOW (ref 137–147)
Lab: 18 U/L (ref 7–56)
Lab: 20 mg/dL (ref 7–25)
Lab: 23 U/L (ref 7–40)
Lab: 28 MMOL/L (ref 21–30)
Lab: 3 10*3/uL (ref 3–12)
Lab: 4 MMOL/L — ABNORMAL LOW (ref 3.5–5.1)
Lab: 4.1 g/dL (ref 3.5–5.0)
Lab: 48 U/L (ref 25–110)
Lab: 6.9 g/dL (ref 6.0–8.0)
Lab: 9.2 mg/dL (ref >60)
Lab: 93 mg/dL (ref 70–100)
Lab: >60 mL/min (ref >60)
Lab: >60 mL/min (ref >60)

## 2017-03-15 LAB — CBC AND DIFF
Lab: 0 10*3/uL (ref 0–0.20)
Lab: 3.9 K/UL — ABNORMAL LOW (ref 4.5–11.0)
Lab: 3.9 M/UL — ABNORMAL LOW (ref 4.4–5.5)

## 2017-03-15 MED ORDER — SODIUM CHLORIDE 0.9 % IJ SOLN
50 mL | Freq: Once | INTRAVENOUS | 0 refills | Status: CP
Start: 2017-03-15 — End: ?
  Administered 2017-03-15: 20:00:00 50 mL via INTRAVENOUS

## 2017-03-15 MED ORDER — IOHEXOL 350 MG IODINE/ML IV SOLN
100 mL | Freq: Once | INTRAVENOUS | 0 refills | Status: CP
Start: 2017-03-15 — End: ?
  Administered 2017-03-15: 20:00:00 100 mL via INTRAVENOUS

## 2017-03-15 MED FILL — TRAMETINIB 2 MG PO TAB: 2 mg | ORAL | 30 days supply | Qty: 30 | Fill #2 | Status: AC

## 2017-03-19 ENCOUNTER — Ambulatory Visit: Admit: 2017-03-19 | Discharge: 2017-04-02 | Payer: MEDICARE

## 2017-04-01 ENCOUNTER — Encounter: Admit: 2017-04-01 | Discharge: 2017-04-01 | Payer: MEDICARE

## 2017-04-01 ENCOUNTER — Ambulatory Visit: Admit: 2017-04-01 | Discharge: 2017-04-01 | Payer: MEDICARE

## 2017-04-01 ENCOUNTER — Encounter: Admit: 2017-04-01 | Discharge: 2017-04-02 | Payer: MEDICARE

## 2017-04-01 DIAGNOSIS — F419 Anxiety disorder, unspecified: ICD-10-CM

## 2017-04-01 DIAGNOSIS — M1990 Unspecified osteoarthritis, unspecified site: ICD-10-CM

## 2017-04-01 DIAGNOSIS — H9190 Unspecified hearing loss, unspecified ear: ICD-10-CM

## 2017-04-01 DIAGNOSIS — R002 Palpitations: ICD-10-CM

## 2017-04-01 DIAGNOSIS — E039 Hypothyroidism, unspecified: ICD-10-CM

## 2017-04-01 DIAGNOSIS — R202 Paresthesia of skin: ICD-10-CM

## 2017-04-01 DIAGNOSIS — F329 Major depressive disorder, single episode, unspecified: ICD-10-CM

## 2017-04-01 DIAGNOSIS — E785 Hyperlipidemia, unspecified: ICD-10-CM

## 2017-04-01 DIAGNOSIS — K219 Gastro-esophageal reflux disease without esophagitis: ICD-10-CM

## 2017-04-01 DIAGNOSIS — I10 Essential (primary) hypertension: ICD-10-CM

## 2017-04-01 DIAGNOSIS — S129XXA Fracture of neck, unspecified, initial encounter: ICD-10-CM

## 2017-04-01 DIAGNOSIS — C439 Malignant melanoma of skin, unspecified: ICD-10-CM

## 2017-04-01 DIAGNOSIS — C61 Malignant neoplasm of prostate: Principal | ICD-10-CM

## 2017-04-01 MED ORDER — GADOBENATE DIMEGLUMINE 529 MG/ML (0.1MMOL/0.2ML) IV SOLN
20 mL | Freq: Once | INTRAVENOUS | 0 refills | Status: CP
Start: 2017-04-01 — End: ?

## 2017-04-02 DIAGNOSIS — C7931 Secondary malignant neoplasm of brain: Principal | ICD-10-CM

## 2017-04-07 ENCOUNTER — Encounter: Admit: 2017-04-07 | Discharge: 2017-04-07 | Payer: MEDICARE

## 2017-04-07 MED FILL — DABRAFENIB 75 MG PO CAP: 75 mg | ORAL | 30 days supply | Qty: 60 | Fill #3 | Status: AC

## 2017-04-07 MED FILL — TRAMETINIB 2 MG PO TAB: 2 mg | ORAL | 30 days supply | Qty: 30 | Fill #3 | Status: AC

## 2017-04-19 ENCOUNTER — Ambulatory Visit: Admit: 2017-04-19 | Discharge: 2017-05-03 | Payer: MEDICARE

## 2017-04-22 ENCOUNTER — Encounter: Admit: 2017-04-22 | Discharge: 2017-04-22 | Payer: MEDICARE

## 2017-04-22 DIAGNOSIS — K219 Gastro-esophageal reflux disease without esophagitis: ICD-10-CM

## 2017-04-22 DIAGNOSIS — M1990 Unspecified osteoarthritis, unspecified site: ICD-10-CM

## 2017-04-22 DIAGNOSIS — F329 Major depressive disorder, single episode, unspecified: ICD-10-CM

## 2017-04-22 DIAGNOSIS — H9190 Unspecified hearing loss, unspecified ear: ICD-10-CM

## 2017-04-22 DIAGNOSIS — R002 Palpitations: ICD-10-CM

## 2017-04-22 DIAGNOSIS — I10 Essential (primary) hypertension: ICD-10-CM

## 2017-04-22 DIAGNOSIS — C439 Malignant melanoma of skin, unspecified: ICD-10-CM

## 2017-04-22 DIAGNOSIS — R202 Paresthesia of skin: ICD-10-CM

## 2017-04-22 DIAGNOSIS — E785 Hyperlipidemia, unspecified: ICD-10-CM

## 2017-04-22 DIAGNOSIS — F419 Anxiety disorder, unspecified: ICD-10-CM

## 2017-04-22 DIAGNOSIS — E039 Hypothyroidism, unspecified: ICD-10-CM

## 2017-04-22 DIAGNOSIS — C61 Malignant neoplasm of prostate: Principal | ICD-10-CM

## 2017-04-22 DIAGNOSIS — S129XXA Fracture of neck, unspecified, initial encounter: ICD-10-CM

## 2017-04-30 ENCOUNTER — Encounter: Admit: 2017-04-30 | Discharge: 2017-04-30 | Payer: MEDICARE

## 2017-04-30 ENCOUNTER — Ambulatory Visit: Admit: 2017-04-30 | Discharge: 2017-04-30 | Payer: MEDICARE

## 2017-04-30 DIAGNOSIS — C61 Malignant neoplasm of prostate: Principal | ICD-10-CM

## 2017-04-30 DIAGNOSIS — I10 Essential (primary) hypertension: ICD-10-CM

## 2017-04-30 DIAGNOSIS — F419 Anxiety disorder, unspecified: ICD-10-CM

## 2017-04-30 DIAGNOSIS — R202 Paresthesia of skin: ICD-10-CM

## 2017-04-30 DIAGNOSIS — C7931 Secondary malignant neoplasm of brain: Principal | ICD-10-CM

## 2017-04-30 DIAGNOSIS — S129XXA Fracture of neck, unspecified, initial encounter: ICD-10-CM

## 2017-04-30 DIAGNOSIS — M1990 Unspecified osteoarthritis, unspecified site: ICD-10-CM

## 2017-04-30 DIAGNOSIS — C439 Malignant melanoma of skin, unspecified: ICD-10-CM

## 2017-04-30 DIAGNOSIS — E785 Hyperlipidemia, unspecified: ICD-10-CM

## 2017-04-30 DIAGNOSIS — F329 Major depressive disorder, single episode, unspecified: ICD-10-CM

## 2017-04-30 DIAGNOSIS — Z006 Encounter for examination for normal comparison and control in clinical research program: ICD-10-CM

## 2017-04-30 DIAGNOSIS — R002 Palpitations: ICD-10-CM

## 2017-04-30 DIAGNOSIS — K219 Gastro-esophageal reflux disease without esophagitis: ICD-10-CM

## 2017-04-30 DIAGNOSIS — H9190 Unspecified hearing loss, unspecified ear: ICD-10-CM

## 2017-04-30 DIAGNOSIS — E039 Hypothyroidism, unspecified: ICD-10-CM

## 2017-04-30 MED ORDER — GADOBENATE DIMEGLUMINE 529 MG/ML (0.1MMOL/0.2ML) IV SOLN
20 mL | Freq: Once | INTRAVENOUS | 0 refills | Status: CP
Start: 2017-04-30 — End: ?
  Administered 2017-04-30: 19:00:00 20 mL via INTRAVENOUS

## 2017-05-03 DIAGNOSIS — C7931 Secondary malignant neoplasm of brain: Principal | ICD-10-CM

## 2017-05-04 ENCOUNTER — Ambulatory Visit: Admit: 2017-05-04 | Discharge: 2017-05-18 | Payer: MEDICARE

## 2017-05-05 ENCOUNTER — Encounter: Admit: 2017-05-05 | Discharge: 2017-05-05 | Payer: MEDICARE

## 2017-05-07 ENCOUNTER — Encounter: Admit: 2017-05-07 | Discharge: 2017-05-07 | Payer: MEDICARE

## 2017-05-10 ENCOUNTER — Encounter: Admit: 2017-05-10 | Discharge: 2017-05-10 | Payer: MEDICARE

## 2017-05-10 DIAGNOSIS — C7931 Secondary malignant neoplasm of brain: Principal | ICD-10-CM

## 2017-05-11 ENCOUNTER — Encounter: Admit: 2017-05-11 | Discharge: 2017-05-11 | Payer: MEDICARE

## 2017-05-11 ENCOUNTER — Ambulatory Visit: Admit: 2017-05-11 | Discharge: 2017-05-11 | Payer: MEDICARE

## 2017-05-13 ENCOUNTER — Encounter: Admit: 2017-05-13 | Discharge: 2017-05-13 | Payer: MEDICARE

## 2017-05-14 ENCOUNTER — Encounter: Admit: 2017-05-14 | Discharge: 2017-05-14 | Payer: MEDICARE

## 2017-05-17 ENCOUNTER — Encounter: Admit: 2017-05-17 | Discharge: 2017-05-17 | Payer: MEDICARE

## 2017-05-17 DIAGNOSIS — C439 Malignant melanoma of skin, unspecified: Principal | ICD-10-CM

## 2017-05-17 DIAGNOSIS — M1990 Unspecified osteoarthritis, unspecified site: ICD-10-CM

## 2017-05-17 DIAGNOSIS — R002 Palpitations: ICD-10-CM

## 2017-05-17 DIAGNOSIS — F329 Major depressive disorder, single episode, unspecified: ICD-10-CM

## 2017-05-17 DIAGNOSIS — C7931 Secondary malignant neoplasm of brain: ICD-10-CM

## 2017-05-17 DIAGNOSIS — C711 Malignant neoplasm of frontal lobe: ICD-10-CM

## 2017-05-17 DIAGNOSIS — E039 Hypothyroidism, unspecified: ICD-10-CM

## 2017-05-17 DIAGNOSIS — K219 Gastro-esophageal reflux disease without esophagitis: ICD-10-CM

## 2017-05-17 DIAGNOSIS — R202 Paresthesia of skin: ICD-10-CM

## 2017-05-17 DIAGNOSIS — H9190 Unspecified hearing loss, unspecified ear: ICD-10-CM

## 2017-05-17 DIAGNOSIS — E785 Hyperlipidemia, unspecified: ICD-10-CM

## 2017-05-17 DIAGNOSIS — C61 Malignant neoplasm of prostate: Principal | ICD-10-CM

## 2017-05-17 DIAGNOSIS — F419 Anxiety disorder, unspecified: ICD-10-CM

## 2017-05-17 DIAGNOSIS — I10 Essential (primary) hypertension: ICD-10-CM

## 2017-05-17 DIAGNOSIS — S129XXA Fracture of neck, unspecified, initial encounter: ICD-10-CM

## 2017-05-17 LAB — CBC AND DIFF
Lab: 0 10*3/uL (ref 0–0.20)
Lab: 3.4 K/UL — ABNORMAL LOW (ref 4.5–11.0)
Lab: 4.1 M/UL — ABNORMAL LOW (ref 4.4–5.5)

## 2017-05-17 LAB — COMPREHENSIVE METABOLIC PANEL
Lab: 0.5 mg/dL (ref 0.3–1.2)
Lab: 0.9 mg/dL (ref 0.4–1.24)
Lab: 104 mg/dL — ABNORMAL HIGH (ref 70–100)
Lab: 137 MMOL/L — ABNORMAL LOW (ref 137–147)
Lab: 14 mg/dL (ref 7–25)
Lab: 22 U/L (ref 7–56)
Lab: 27 MMOL/L (ref 21–30)
Lab: 3.9 g/dL (ref 3.5–5.0)
Lab: 35 U/L (ref 7–40)
Lab: 4 MMOL/L — ABNORMAL LOW (ref 3.5–5.1)
Lab: 52 U/L (ref 25–110)
Lab: 6 10*3/uL (ref 3–12)
Lab: 7 g/dL (ref 6.0–8.0)
Lab: 9 mg/dL (ref 8.5–10.6)
Lab: >60 mL/min (ref >60)
Lab: >60 mL/min (ref >60)

## 2017-05-17 LAB — LDH-LACTATE DEHYDROGENASE: Lab: 203 U/L (ref 100–210)

## 2017-05-17 MED ORDER — DEXAMETHASONE 2 MG PO TAB
2 mg | ORAL_TABLET | Freq: Two times a day (BID) | ORAL | 0 refills | 12.50000 days | Status: AC
Start: 2017-05-17 — End: 2017-07-06

## 2017-05-17 MED FILL — TRAMETINIB 2 MG PO TAB: 2 mg | ORAL | 30 days supply | Qty: 30 | Status: CN

## 2017-05-18 DIAGNOSIS — C7931 Secondary malignant neoplasm of brain: Principal | ICD-10-CM

## 2017-05-18 DIAGNOSIS — C439 Malignant melanoma of skin, unspecified: ICD-10-CM

## 2017-05-19 ENCOUNTER — Ambulatory Visit: Admit: 2017-05-19 | Discharge: 2017-06-02 | Payer: MEDICARE

## 2017-05-19 DIAGNOSIS — C711 Malignant neoplasm of frontal lobe: Secondary | ICD-10-CM

## 2017-05-20 ENCOUNTER — Encounter: Admit: 2017-05-20 | Discharge: 2017-05-20 | Payer: MEDICARE

## 2017-05-20 ENCOUNTER — Ambulatory Visit: Admit: 2017-05-20 | Discharge: 2017-05-20 | Payer: MEDICARE

## 2017-05-20 DIAGNOSIS — C439 Malignant melanoma of skin, unspecified: Principal | ICD-10-CM

## 2017-05-21 ENCOUNTER — Encounter: Admit: 2017-05-21 | Discharge: 2017-05-21 | Payer: MEDICARE

## 2017-05-24 ENCOUNTER — Encounter: Admit: 2017-05-24 | Discharge: 2017-05-24 | Payer: MEDICARE

## 2017-05-24 DIAGNOSIS — D709 Neutropenia, unspecified: ICD-10-CM

## 2017-05-24 DIAGNOSIS — R5383 Other fatigue: ICD-10-CM

## 2017-05-24 DIAGNOSIS — C799 Secondary malignant neoplasm of unspecified site: ICD-10-CM

## 2017-05-24 DIAGNOSIS — C439 Malignant melanoma of skin, unspecified: Principal | ICD-10-CM

## 2017-05-24 DIAGNOSIS — R5081 Fever presenting with conditions classified elsewhere: ICD-10-CM

## 2017-05-24 DIAGNOSIS — Z923 Personal history of irradiation: ICD-10-CM

## 2017-05-24 LAB — CBC AND DIFF
Lab: 0 % (ref 0–5)
Lab: 0 % (ref >60)
Lab: 0 10*3/uL (ref 0–0.20)
Lab: 0 10*3/uL (ref 0–0.45)
Lab: 12 g/dL — ABNORMAL LOW (ref >60)
Lab: 13 % (ref 11–15)
Lab: 18 % — ABNORMAL LOW (ref 24–44)
Lab: 188 K/UL (ref 150–400)
Lab: 3.8 M/UL — ABNORMAL LOW (ref 4.4–5.5)
Lab: 31 pg (ref 26–34)
Lab: 34 g/dL (ref 32.0–36.0)
Lab: 35 % — ABNORMAL LOW (ref >60)
Lab: 4 10*3/uL — ABNORMAL LOW (ref >60)
Lab: 7.3 FL (ref 7–11)
Lab: 73 % (ref 41–77)
Lab: 9 % (ref 4–12)
Lab: 92 FL (ref 80–100)

## 2017-05-24 LAB — COMPREHENSIVE METABOLIC PANEL: Lab: 135 MMOL/L — ABNORMAL LOW (ref >60)

## 2017-05-25 ENCOUNTER — Encounter: Admit: 2017-05-25 | Discharge: 2017-05-25 | Payer: MEDICARE

## 2017-05-25 MED FILL — TRAMETINIB 2 MG PO TAB: 2 mg | ORAL | 30 days supply | Qty: 30 | Fill #4 | Status: AC

## 2017-05-25 MED FILL — DABRAFENIB 75 MG PO CAP: 75 mg | ORAL | 30 days supply | Qty: 60 | Fill #4 | Status: AC

## 2017-05-27 ENCOUNTER — Encounter: Admit: 2017-05-27 | Discharge: 2017-05-27 | Payer: MEDICARE

## 2017-06-02 ENCOUNTER — Encounter: Admit: 2017-06-02 | Discharge: 2017-06-02 | Payer: MEDICARE

## 2017-06-02 DIAGNOSIS — C439 Malignant melanoma of skin, unspecified: Principal | ICD-10-CM

## 2017-06-02 DIAGNOSIS — C711 Malignant neoplasm of frontal lobe: ICD-10-CM

## 2017-06-02 DIAGNOSIS — C7931 Secondary malignant neoplasm of brain: Secondary | ICD-10-CM

## 2017-06-02 MED ORDER — NIVOLUMAB IVPB
480 mg | Freq: Once | INTRAVENOUS | 0 refills | Status: CN
Start: 2017-06-02 — End: ?

## 2017-06-03 ENCOUNTER — Encounter: Admit: 2017-06-03 | Discharge: 2017-06-03 | Payer: MEDICARE

## 2017-06-03 ENCOUNTER — Encounter: Admit: 2017-06-03 | Discharge: 2017-06-04 | Payer: MEDICARE

## 2017-06-03 DIAGNOSIS — E785 Hyperlipidemia, unspecified: ICD-10-CM

## 2017-06-03 DIAGNOSIS — K219 Gastro-esophageal reflux disease without esophagitis: ICD-10-CM

## 2017-06-03 DIAGNOSIS — R5383 Other fatigue: Secondary | ICD-10-CM

## 2017-06-03 DIAGNOSIS — Z7982 Long term (current) use of aspirin: ICD-10-CM

## 2017-06-03 DIAGNOSIS — Z79899 Other long term (current) drug therapy: ICD-10-CM

## 2017-06-03 DIAGNOSIS — C439 Malignant melanoma of skin, unspecified: ICD-10-CM

## 2017-06-03 DIAGNOSIS — F419 Anxiety disorder, unspecified: ICD-10-CM

## 2017-06-03 DIAGNOSIS — F329 Major depressive disorder, single episode, unspecified: ICD-10-CM

## 2017-06-03 DIAGNOSIS — I10 Essential (primary) hypertension: ICD-10-CM

## 2017-06-03 DIAGNOSIS — C773 Secondary and unspecified malignant neoplasm of axilla and upper limb lymph nodes: ICD-10-CM

## 2017-06-03 DIAGNOSIS — R202 Paresthesia of skin: ICD-10-CM

## 2017-06-03 DIAGNOSIS — E039 Hypothyroidism, unspecified: ICD-10-CM

## 2017-06-03 DIAGNOSIS — Z5112 Encounter for antineoplastic immunotherapy: ICD-10-CM

## 2017-06-03 DIAGNOSIS — I890 Lymphedema, not elsewhere classified: ICD-10-CM

## 2017-06-03 DIAGNOSIS — C4359 Malignant melanoma of other part of trunk: Principal | ICD-10-CM

## 2017-06-03 DIAGNOSIS — H9190 Unspecified hearing loss, unspecified ear: ICD-10-CM

## 2017-06-03 DIAGNOSIS — C61 Malignant neoplasm of prostate: Principal | ICD-10-CM

## 2017-06-03 DIAGNOSIS — S129XXA Fracture of neck, unspecified, initial encounter: ICD-10-CM

## 2017-06-03 DIAGNOSIS — R002 Palpitations: ICD-10-CM

## 2017-06-03 DIAGNOSIS — M1990 Unspecified osteoarthritis, unspecified site: ICD-10-CM

## 2017-06-03 LAB — COMPREHENSIVE METABOLIC PANEL
Lab: 0.3 mg/dL — ABNORMAL LOW (ref 0.3–1.2)
Lab: 21 U/L (ref 7–56)
Lab: 25 U/L (ref 7–40)
Lab: 28 MMOL/L (ref 21–30)
Lab: 3 10*3/uL (ref 3–12)
Lab: 3.8 g/dL — ABNORMAL HIGH (ref 3.5–5.0)
Lab: 50 U/L — ABNORMAL LOW (ref 25–110)
Lab: 6.6 g/dL (ref 6.0–8.0)
Lab: 9 mg/dL (ref 8.5–10.6)
Lab: >60 mL/min (ref >60)
Lab: >60 mL/min — ABNORMAL LOW (ref >60)

## 2017-06-03 LAB — CBC AND DIFF
Lab: 0 10*3/uL (ref 0–0.20)
Lab: 0 10*3/uL (ref 0–0.45)

## 2017-06-03 LAB — FREE T4-FREE THYROXINE: Lab: 0.7 ng/dL (ref 0.6–1.6)

## 2017-06-03 MED ORDER — NIVOLUMAB IVPB
480 mg | Freq: Once | INTRAVENOUS | 0 refills | Status: CP
Start: 2017-06-03 — End: ?
  Administered 2017-06-03 (×2): 480 mg via INTRAVENOUS

## 2017-06-03 MED ORDER — SULFAMETHOXAZOLE-TRIMETHOPRIM 800-160 MG PO TAB
1 | ORAL_TABLET | ORAL | 3 refills | Status: AC
Start: 2017-06-03 — End: 2017-08-30

## 2017-06-07 ENCOUNTER — Encounter: Admit: 2017-06-07 | Discharge: 2017-06-07 | Payer: MEDICARE

## 2017-06-08 ENCOUNTER — Encounter: Admit: 2017-06-08 | Discharge: 2017-06-08 | Payer: MEDICARE

## 2017-06-11 ENCOUNTER — Encounter: Admit: 2017-06-11 | Discharge: 2017-06-11 | Payer: MEDICARE

## 2017-06-11 DIAGNOSIS — Z79899 Other long term (current) drug therapy: Principal | ICD-10-CM

## 2017-06-11 DIAGNOSIS — C439 Malignant melanoma of skin, unspecified: ICD-10-CM

## 2017-06-11 MED ORDER — TRAMETINIB 2 MG PO TAB
2 mg | ORAL_TABLET | Freq: Every day | ORAL | 3 refills
Start: 2017-06-11 — End: ?

## 2017-06-11 MED ORDER — DABRAFENIB 75 MG PO CAP
75 mg | ORAL_CAPSULE | Freq: Two times a day (BID) | ORAL | 3 refills
Start: 2017-06-11 — End: ?

## 2017-06-15 ENCOUNTER — Encounter: Admit: 2017-06-15 | Discharge: 2017-06-15 | Payer: MEDICARE

## 2017-06-17 ENCOUNTER — Encounter: Admit: 2017-06-17 | Discharge: 2017-06-17 | Payer: MEDICARE

## 2017-06-17 DIAGNOSIS — C439 Malignant melanoma of skin, unspecified: Principal | ICD-10-CM

## 2017-06-17 MED ORDER — SODIUM CHLORIDE 0.9 % IV SOLP
1000 mL | Freq: Once | INTRAVENOUS | 0 refills | Status: CN
Start: 2017-06-17 — End: ?

## 2017-06-18 ENCOUNTER — Encounter: Admit: 2017-06-18 | Discharge: 2017-06-18 | Payer: MEDICARE

## 2017-06-18 DIAGNOSIS — K219 Gastro-esophageal reflux disease without esophagitis: ICD-10-CM

## 2017-06-18 DIAGNOSIS — E039 Hypothyroidism, unspecified: ICD-10-CM

## 2017-06-18 DIAGNOSIS — Z7982 Long term (current) use of aspirin: ICD-10-CM

## 2017-06-18 DIAGNOSIS — H9190 Unspecified hearing loss, unspecified ear: ICD-10-CM

## 2017-06-18 DIAGNOSIS — S129XXA Fracture of neck, unspecified, initial encounter: ICD-10-CM

## 2017-06-18 DIAGNOSIS — R5383 Other fatigue: ICD-10-CM

## 2017-06-18 DIAGNOSIS — E785 Hyperlipidemia, unspecified: ICD-10-CM

## 2017-06-18 DIAGNOSIS — C4359 Malignant melanoma of other part of trunk: Principal | ICD-10-CM

## 2017-06-18 DIAGNOSIS — R509 Fever, unspecified: ICD-10-CM

## 2017-06-18 DIAGNOSIS — C773 Secondary and unspecified malignant neoplasm of axilla and upper limb lymph nodes: ICD-10-CM

## 2017-06-18 DIAGNOSIS — C439 Malignant melanoma of skin, unspecified: Secondary | ICD-10-CM

## 2017-06-18 DIAGNOSIS — R202 Paresthesia of skin: ICD-10-CM

## 2017-06-18 DIAGNOSIS — F419 Anxiety disorder, unspecified: ICD-10-CM

## 2017-06-18 DIAGNOSIS — M1990 Unspecified osteoarthritis, unspecified site: ICD-10-CM

## 2017-06-18 DIAGNOSIS — I10 Essential (primary) hypertension: ICD-10-CM

## 2017-06-18 DIAGNOSIS — Z5112 Encounter for antineoplastic immunotherapy: ICD-10-CM

## 2017-06-18 DIAGNOSIS — F329 Major depressive disorder, single episode, unspecified: ICD-10-CM

## 2017-06-18 DIAGNOSIS — C61 Malignant neoplasm of prostate: Principal | ICD-10-CM

## 2017-06-18 DIAGNOSIS — R002 Palpitations: ICD-10-CM

## 2017-06-18 LAB — COMPREHENSIVE METABOLIC PANEL
Lab: 0.5 mg/dL (ref 0.3–1.2)
Lab: 0.9 mg/dL (ref 0.4–1.24)
Lab: 104 mg/dL — ABNORMAL HIGH (ref 70–100)
Lab: 132 MMOL/L — ABNORMAL LOW (ref 137–147)
Lab: 15 mg/dL (ref 7–25)
Lab: 19 U/L (ref 7–56)
Lab: 23 U/L (ref 7–40)
Lab: 27 MMOL/L (ref 21–30)
Lab: 4.1 MMOL/L (ref 3.5–5.1)
Lab: 4.2 g/dL (ref 3.5–5.0)
Lab: 49 U/L (ref 25–110)
Lab: 6 (ref 3–12)
Lab: 7.1 g/dL (ref 6.0–8.0)
Lab: 9.5 mg/dL (ref 8.5–10.6)
Lab: 99 MMOL/L (ref 98–110)
Lab: >60 mL/min (ref >60)

## 2017-06-18 LAB — CBC AND DIFF
Lab: 0 10*3/uL (ref 0–0.20)
Lab: 0 10*3/uL (ref 0–0.45)
Lab: 0.3 10*3/uL (ref 0–0.80)
Lab: 0.7 10*3/uL — ABNORMAL LOW (ref 1.0–4.8)
Lab: 1 % (ref 0–2)
Lab: 1 % (ref 0–5)
Lab: 13 % (ref 11–15)
Lab: 13 g/dL — ABNORMAL LOW (ref 13.5–16.5)
Lab: 2.5 10*3/uL (ref 1.8–7.0)
Lab: 20 % — ABNORMAL LOW (ref 24–44)
Lab: 226 10*3/uL (ref 150–400)
Lab: 3.5 10*3/uL — ABNORMAL LOW (ref >60)
Lab: 31 pg (ref 26–34)
Lab: 4.2 M/UL — ABNORMAL LOW (ref 4.4–5.5)
Lab: 6.8 FL — ABNORMAL LOW (ref 7–11)
Lab: 70 % (ref 41–77)
Lab: 8 % (ref 4–12)
Lab: 91 FL (ref 80–100)

## 2017-06-18 LAB — CORTISOL,RANDOM: Lab: 4.4 ug/dL — ABNORMAL LOW (ref 5.0–20.0)

## 2017-06-18 MED ORDER — ONDANSETRON HCL (PF) 4 MG/2 ML IJ SOLN
8 mg | Freq: Once | INTRAVENOUS | 0 refills | Status: CP
Start: 2017-06-18 — End: ?
  Administered 2017-06-18: 18:00:00 8 mg via INTRAVENOUS

## 2017-06-18 MED ORDER — SODIUM CHLORIDE 0.9 % IV SOLP
1000 mL | Freq: Once | INTRAVENOUS | 0 refills | Status: CP
Start: 2017-06-18 — End: ?
  Administered 2017-06-18: 18:00:00 1000 mL via INTRAVENOUS

## 2017-06-18 MED ORDER — ONDANSETRON 4 MG PO TBDI
4 mg | ORAL_TABLET | ORAL | 3 refills | 8.00000 days | Status: AC | PRN
Start: 2017-06-18 — End: ?

## 2017-06-18 MED ORDER — ONDANSETRON HCL (PF) 4 MG/2 ML IJ SOLN
8 mg | Freq: Once | INTRAVENOUS | 0 refills | Status: CN
Start: 2017-06-18 — End: ?

## 2017-06-19 LAB — ACTH

## 2017-06-22 ENCOUNTER — Encounter: Admit: 2017-06-22 | Discharge: 2017-06-22 | Payer: MEDICARE

## 2017-06-23 ENCOUNTER — Encounter: Admit: 2017-06-23 | Discharge: 2017-06-23 | Payer: MEDICARE

## 2017-06-23 DIAGNOSIS — C439 Malignant melanoma of skin, unspecified: ICD-10-CM

## 2017-06-23 DIAGNOSIS — Z79899 Other long term (current) drug therapy: Principal | ICD-10-CM

## 2017-06-23 MED ORDER — TRAMETINIB 2 MG PO TAB
2 mg | ORAL_TABLET | Freq: Every day | ORAL | 3 refills
Start: 2017-06-23 — End: ?

## 2017-06-23 MED ORDER — DABRAFENIB 75 MG PO CAP
75 mg | ORAL_CAPSULE | Freq: Two times a day (BID) | ORAL | 3 refills
Start: 2017-06-23 — End: ?

## 2017-06-24 LAB — CULTURE-BLOOD W/SENSITIVITY

## 2017-06-25 ENCOUNTER — Encounter: Admit: 2017-06-25 | Discharge: 2017-06-25 | Payer: MEDICARE

## 2017-07-01 ENCOUNTER — Encounter: Admit: 2017-07-01 | Discharge: 2017-07-01 | Payer: MEDICARE

## 2017-07-01 MED ORDER — NIVOLUMAB IVPB
480 mg | Freq: Once | INTRAVENOUS | 0 refills | Status: CN
Start: 2017-07-01 — End: ?

## 2017-07-02 ENCOUNTER — Encounter: Admit: 2017-07-02 | Discharge: 2017-07-02 | Payer: MEDICARE

## 2017-07-02 DIAGNOSIS — C4359 Malignant melanoma of other part of trunk: Principal | ICD-10-CM

## 2017-07-02 DIAGNOSIS — H9190 Unspecified hearing loss, unspecified ear: ICD-10-CM

## 2017-07-02 DIAGNOSIS — R002 Palpitations: ICD-10-CM

## 2017-07-02 DIAGNOSIS — R5383 Other fatigue: ICD-10-CM

## 2017-07-02 DIAGNOSIS — Z79899 Other long term (current) drug therapy: ICD-10-CM

## 2017-07-02 DIAGNOSIS — F419 Anxiety disorder, unspecified: ICD-10-CM

## 2017-07-02 DIAGNOSIS — C773 Secondary and unspecified malignant neoplasm of axilla and upper limb lymph nodes: ICD-10-CM

## 2017-07-02 DIAGNOSIS — S129XXA Fracture of neck, unspecified, initial encounter: ICD-10-CM

## 2017-07-02 DIAGNOSIS — E785 Hyperlipidemia, unspecified: ICD-10-CM

## 2017-07-02 DIAGNOSIS — Z5112 Encounter for antineoplastic immunotherapy: ICD-10-CM

## 2017-07-02 DIAGNOSIS — C439 Malignant melanoma of skin, unspecified: ICD-10-CM

## 2017-07-02 DIAGNOSIS — F329 Major depressive disorder, single episode, unspecified: ICD-10-CM

## 2017-07-02 DIAGNOSIS — M1990 Unspecified osteoarthritis, unspecified site: ICD-10-CM

## 2017-07-02 DIAGNOSIS — E039 Hypothyroidism, unspecified: ICD-10-CM

## 2017-07-02 DIAGNOSIS — I10 Essential (primary) hypertension: ICD-10-CM

## 2017-07-02 DIAGNOSIS — R202 Paresthesia of skin: ICD-10-CM

## 2017-07-02 DIAGNOSIS — C61 Malignant neoplasm of prostate: Principal | ICD-10-CM

## 2017-07-02 DIAGNOSIS — K219 Gastro-esophageal reflux disease without esophagitis: ICD-10-CM

## 2017-07-02 LAB — CBC AND DIFF: Lab: 2.5 10*3/uL — ABNORMAL LOW (ref 4.5–11.0)

## 2017-07-02 LAB — COMPREHENSIVE METABOLIC PANEL
Lab: 133 MMOL/L — ABNORMAL LOW (ref 137–147)
Lab: 3.9 MMOL/L — ABNORMAL LOW (ref 3.5–5.1)

## 2017-07-02 MED ORDER — NIVOLUMAB IVPB
480 mg | Freq: Once | INTRAVENOUS | 0 refills | Status: CP
Start: 2017-07-02 — End: ?
  Administered 2017-07-02 (×2): 480 mg via INTRAVENOUS

## 2017-07-02 MED ORDER — LANSOPRAZOLE 30 MG PO CPDR
30 mg | ORAL_CAPSULE | Freq: Every day | ORAL | 3 refills | 45.00000 days | Status: AC
Start: 2017-07-02 — End: 2017-07-06

## 2017-07-05 ENCOUNTER — Encounter: Admit: 2017-07-05 | Discharge: 2017-07-05 | Payer: MEDICARE

## 2017-07-06 ENCOUNTER — Encounter: Admit: 2017-07-06 | Discharge: 2017-07-06 | Payer: MEDICARE

## 2017-07-06 DIAGNOSIS — C439 Malignant melanoma of skin, unspecified: Principal | ICD-10-CM

## 2017-07-06 MED ORDER — OMEPRAZOLE 40 MG PO CPDR
40 mg | ORAL_CAPSULE | Freq: Every day | ORAL | 3 refills | Status: AC
Start: 2017-07-06 — End: ?

## 2017-07-06 MED ORDER — MECLIZINE 12.5 MG PO TAB
12.5 mg | Freq: Three times a day (TID) | ORAL | 0 refills | 10.00000 days | Status: AC | PRN
Start: 2017-07-06 — End: 2017-08-30

## 2017-07-06 MED ORDER — DEXAMETHASONE 2 MG PO TAB
2 mg | ORAL_TABLET | Freq: Two times a day (BID) | ORAL | 1 refills | 12.50000 days | Status: AC
Start: 2017-07-06 — End: ?

## 2017-07-13 ENCOUNTER — Encounter: Admit: 2017-07-13 | Discharge: 2017-07-13 | Payer: MEDICARE

## 2017-07-13 MED ORDER — NIVOLUMAB IVPB
480 mg | Freq: Once | INTRAVENOUS | 0 refills | Status: CN
Start: 2017-07-13 — End: ?

## 2017-07-30 ENCOUNTER — Encounter: Admit: 2017-07-30 | Discharge: 2017-07-30 | Payer: MEDICARE

## 2017-07-30 ENCOUNTER — Ambulatory Visit: Admit: 2017-07-30 | Discharge: 2017-07-30 | Payer: MEDICARE

## 2017-07-30 DIAGNOSIS — E785 Hyperlipidemia, unspecified: ICD-10-CM

## 2017-07-30 DIAGNOSIS — C773 Secondary and unspecified malignant neoplasm of axilla and upper limb lymph nodes: ICD-10-CM

## 2017-07-30 DIAGNOSIS — Z006 Encounter for examination for normal comparison and control in clinical research program: ICD-10-CM

## 2017-07-30 DIAGNOSIS — C439 Malignant melanoma of skin, unspecified: ICD-10-CM

## 2017-07-30 DIAGNOSIS — C4359 Malignant melanoma of other part of trunk: Principal | ICD-10-CM

## 2017-07-30 DIAGNOSIS — C7931 Secondary malignant neoplasm of brain: ICD-10-CM

## 2017-07-30 DIAGNOSIS — S129XXA Fracture of neck, unspecified, initial encounter: ICD-10-CM

## 2017-07-30 DIAGNOSIS — F329 Major depressive disorder, single episode, unspecified: ICD-10-CM

## 2017-07-30 DIAGNOSIS — Z79899 Other long term (current) drug therapy: ICD-10-CM

## 2017-07-30 DIAGNOSIS — C61 Malignant neoplasm of prostate: Principal | ICD-10-CM

## 2017-07-30 DIAGNOSIS — Z5112 Encounter for antineoplastic immunotherapy: ICD-10-CM

## 2017-07-30 DIAGNOSIS — R5383 Other fatigue: Secondary | ICD-10-CM

## 2017-07-30 DIAGNOSIS — M1990 Unspecified osteoarthritis, unspecified site: ICD-10-CM

## 2017-07-30 DIAGNOSIS — R202 Paresthesia of skin: ICD-10-CM

## 2017-07-30 DIAGNOSIS — I10 Essential (primary) hypertension: ICD-10-CM

## 2017-07-30 DIAGNOSIS — F419 Anxiety disorder, unspecified: ICD-10-CM

## 2017-07-30 DIAGNOSIS — H9190 Unspecified hearing loss, unspecified ear: ICD-10-CM

## 2017-07-30 DIAGNOSIS — K219 Gastro-esophageal reflux disease without esophagitis: ICD-10-CM

## 2017-07-30 DIAGNOSIS — R002 Palpitations: ICD-10-CM

## 2017-07-30 DIAGNOSIS — E039 Hypothyroidism, unspecified: ICD-10-CM

## 2017-07-30 LAB — COMPREHENSIVE METABOLIC PANEL
Lab: 0.6 mg/dL (ref 0.3–1.2)
Lab: 0.8 mg/dL (ref 0.4–1.24)
Lab: 135 MMOL/L — ABNORMAL LOW (ref 137–147)
Lab: 17 mg/dL (ref >60)
Lab: 3.8 MMOL/L (ref 3.5–5.1)
Lab: 4.1 g/dL (ref 3.5–5.0)
Lab: 51 U/L (ref 25–110)
Lab: 7 g/dL (ref 6.0–8.0)
Lab: 9.4 mg/dL (ref 8.5–10.6)
Lab: 95 mg/dL (ref >60)
Lab: 99 MMOL/L (ref 98–110)

## 2017-07-30 LAB — URINALYSIS DIPSTICK
Lab: NEGATIVE
Lab: NEGATIVE
Lab: NEGATIVE
Lab: NEGATIVE
Lab: NEGATIVE
Lab: NEGATIVE
Lab: NEGATIVE
Lab: NEGATIVE

## 2017-07-30 LAB — CBC AND DIFF
Lab: 12 g/dL — ABNORMAL LOW (ref 13.5–16.5)
Lab: 36 % — ABNORMAL LOW (ref 40–50)
Lab: 4 M/UL — ABNORMAL LOW (ref 4.4–5.5)
Lab: 7.1 10*3/uL (ref 4.5–11.0)

## 2017-07-30 LAB — URINALYSIS, MICROSCOPIC

## 2017-07-30 MED ORDER — SODIUM CHLORIDE 0.9 % IJ SOLN
50 mL | Freq: Once | INTRAVENOUS | 0 refills | Status: CP
Start: 2017-07-30 — End: ?
  Administered 2017-07-30: 18:00:00 50 mL via INTRAVENOUS

## 2017-07-30 MED ORDER — GADOBENATE DIMEGLUMINE 529 MG/ML (0.1MMOL/0.2ML) IV SOLN
20 mL | Freq: Once | INTRAVENOUS | 0 refills | Status: CP
Start: 2017-07-30 — End: ?
  Administered 2017-07-30: 17:00:00 20 mL via INTRAVENOUS

## 2017-07-30 MED ORDER — IOHEXOL 350 MG IODINE/ML IV SOLN
100 mL | Freq: Once | INTRAVENOUS | 0 refills | Status: CP
Start: 2017-07-30 — End: ?
  Administered 2017-07-30: 18:00:00 100 mL via INTRAVENOUS

## 2017-07-30 MED ORDER — NIVOLUMAB IVPB
480 mg | Freq: Once | INTRAVENOUS | 0 refills | Status: CP
Start: 2017-07-30 — End: ?
  Administered 2017-07-30 (×2): 480 mg via INTRAVENOUS

## 2017-07-31 ENCOUNTER — Encounter: Admit: 2017-07-31 | Discharge: 2017-07-31 | Payer: MEDICARE

## 2017-08-03 ENCOUNTER — Ambulatory Visit: Admit: 2017-08-03 | Discharge: 2017-08-18 | Payer: MEDICARE

## 2017-08-03 MED ORDER — NIVOLUMAB IVPB
480 mg | Freq: Once | INTRAVENOUS | 0 refills | Status: CN
Start: 2017-08-03 — End: ?

## 2017-08-05 ENCOUNTER — Ambulatory Visit: Admit: 2017-08-05 | Discharge: 2017-08-05 | Payer: MEDICARE

## 2017-08-05 ENCOUNTER — Encounter: Admit: 2017-08-05 | Discharge: 2017-08-05 | Payer: MEDICARE

## 2017-08-05 DIAGNOSIS — C7931 Secondary malignant neoplasm of brain: Principal | ICD-10-CM

## 2017-08-18 DIAGNOSIS — C7931 Secondary malignant neoplasm of brain: Principal | ICD-10-CM

## 2017-08-30 ENCOUNTER — Encounter: Admit: 2017-08-30 | Discharge: 2017-08-30 | Payer: MEDICARE

## 2017-08-30 DIAGNOSIS — R52 Pain, unspecified: ICD-10-CM

## 2017-08-30 DIAGNOSIS — F419 Anxiety disorder, unspecified: ICD-10-CM

## 2017-08-30 DIAGNOSIS — C439 Malignant melanoma of skin, unspecified: Principal | ICD-10-CM

## 2017-08-30 DIAGNOSIS — G510 Bell's palsy: ICD-10-CM

## 2017-08-30 LAB — URINALYSIS DIPSTICK
Lab: NEGATIVE
Lab: NEGATIVE
Lab: NEGATIVE 10*3/uL (ref 0–0.45)
Lab: NEGATIVE K/UL — ABNORMAL HIGH (ref 0–0.80)

## 2017-08-30 LAB — CBC AND DIFF
Lab: 0 % (ref 0–5)
Lab: 0 10*3/uL (ref 0–0.20)
Lab: 0 10*3/uL (ref 0–0.45)
Lab: 0.4 10*3/uL (ref 0–0.80)
Lab: 0.8 10*3/uL — ABNORMAL LOW (ref 1.0–4.8)
Lab: 1 % (ref >60)
Lab: 10 % (ref 4–12)
Lab: 12 g/dL — ABNORMAL LOW (ref 13.5–16.5)
Lab: 13 % (ref 11–15)
Lab: 18 % — ABNORMAL LOW (ref 24–44)
Lab: 203 10*3/uL (ref 150–400)
Lab: 3.1 10*3/uL (ref >60)
Lab: 31 pg (ref 26–34)
Lab: 33 g/dL (ref 32.0–36.0)
Lab: 37 % — ABNORMAL LOW (ref 40–50)
Lab: 4 M/UL — ABNORMAL LOW (ref 4.4–5.5)
Lab: 4.4 10*3/uL — ABNORMAL LOW (ref 4.5–11.0)
Lab: 6.8 FL — ABNORMAL LOW (ref 7–11)
Lab: 71 % (ref 41–77)
Lab: 91 FL (ref 80–100)

## 2017-08-30 LAB — COMPREHENSIVE METABOLIC PANEL: Lab: 135 MMOL/L — ABNORMAL LOW (ref 137–147)

## 2017-08-30 MED ORDER — GADOBENATE DIMEGLUMINE 529 MG/ML (0.1MMOL/0.2ML) IV SOLN
20 mL | Freq: Once | INTRAVENOUS | 0 refills | Status: CP
Start: 2017-08-30 — End: ?
  Administered 2017-08-30: 19:00:00 20 mL via INTRAVENOUS

## 2017-08-31 ENCOUNTER — Encounter: Admit: 2017-08-31 | Discharge: 2017-08-31 | Payer: MEDICARE

## 2017-09-01 ENCOUNTER — Encounter: Admit: 2017-09-01 | Discharge: 2017-09-01 | Payer: MEDICARE

## 2017-09-01 LAB — CULTURE-URINE W/SENSITIVITY

## 2017-09-03 ENCOUNTER — Encounter: Admit: 2017-09-03 | Discharge: 2017-09-03 | Payer: MEDICARE

## 2017-09-05 LAB — CULTURE-BLOOD W/SENSITIVITY

## 2017-09-19 ENCOUNTER — Ambulatory Visit: Admit: 2017-09-19 | Discharge: 2017-10-03 | Payer: MEDICARE

## 2017-09-22 ENCOUNTER — Encounter: Admit: 2017-09-22 | Discharge: 2017-09-22 | Payer: MEDICARE

## 2017-10-14 ENCOUNTER — Encounter: Admit: 2017-10-14 | Discharge: 2017-10-14 | Payer: MEDICARE

## 2017-10-25 ENCOUNTER — Encounter: Admit: 2017-10-25 | Discharge: 2017-10-25 | Payer: MEDICARE

## 2017-10-26 ENCOUNTER — Encounter: Admit: 2017-10-26 | Discharge: 2017-10-26 | Payer: MEDICARE

## 2017-10-26 DIAGNOSIS — C7931 Secondary malignant neoplasm of brain: Principal | ICD-10-CM

## 2017-10-27 ENCOUNTER — Encounter: Admit: 2017-10-27 | Discharge: 2017-10-27 | Payer: MEDICARE

## 2017-11-01 ENCOUNTER — Encounter: Admit: 2017-11-01 | Discharge: 2017-11-01 | Payer: MEDICARE

## 2017-11-03 ENCOUNTER — Ambulatory Visit: Admit: 2017-11-03 | Discharge: 2017-11-18 | Payer: MEDICARE

## 2018-01-19 DEATH — deceased

## 2018-05-17 ENCOUNTER — Encounter: Admit: 2018-05-17 | Discharge: 2018-05-17 | Payer: MEDICARE

## 2022-04-14 ENCOUNTER — Encounter: Admit: 2022-04-14 | Discharge: 2022-04-14 | Payer: MEDICARE
